# Patient Record
Sex: Male | Born: 1950
Health system: Southern US, Community
[De-identification: ages and names within clinical notes are randomized; demographics above are authoritative.]

## PROBLEM LIST (undated history)

## (undated) DIAGNOSIS — E119 Type 2 diabetes mellitus without complications: Secondary | ICD-10-CM

## (undated) DIAGNOSIS — K219 Gastro-esophageal reflux disease without esophagitis: Secondary | ICD-10-CM

## (undated) DIAGNOSIS — K279 Peptic ulcer, site unspecified, unspecified as acute or chronic, without hemorrhage or perforation: Secondary | ICD-10-CM

## (undated) DIAGNOSIS — I10 Essential (primary) hypertension: Secondary | ICD-10-CM

## (undated) HISTORY — PX: PROSTATE SURGERY: SHX751

## (undated) HISTORY — PX: COLONOSCOPY: SHX174

## (undated) HISTORY — PX: ESOPHAGOGASTRODUODENOSCOPY: SHX1529

---

## 2005-10-07 ENCOUNTER — Ambulatory Visit: Payer: Self-pay | Admitting: Gastroenterology

## 2005-10-28 ENCOUNTER — Ambulatory Visit: Payer: Self-pay | Admitting: Gastroenterology

## 2007-02-15 ENCOUNTER — Ambulatory Visit: Payer: Self-pay | Admitting: Internal Medicine

## 2007-03-19 ENCOUNTER — Ambulatory Visit: Payer: Self-pay | Admitting: Gastroenterology

## 2008-01-09 ENCOUNTER — Ambulatory Visit: Payer: Self-pay | Admitting: Family Medicine

## 2009-07-16 ENCOUNTER — Emergency Department: Payer: Self-pay | Admitting: Unknown Physician Specialty

## 2010-10-03 DIAGNOSIS — R39198 Other difficulties with micturition: Secondary | ICD-10-CM | POA: Insufficient documentation

## 2010-10-03 DIAGNOSIS — R972 Elevated prostate specific antigen [PSA]: Secondary | ICD-10-CM | POA: Insufficient documentation

## 2010-10-03 DIAGNOSIS — N401 Enlarged prostate with lower urinary tract symptoms: Secondary | ICD-10-CM | POA: Insufficient documentation

## 2010-10-03 DIAGNOSIS — R350 Frequency of micturition: Secondary | ICD-10-CM | POA: Insufficient documentation

## 2010-10-03 DIAGNOSIS — R351 Nocturia: Secondary | ICD-10-CM | POA: Insufficient documentation

## 2010-10-03 DIAGNOSIS — Z8042 Family history of malignant neoplasm of prostate: Secondary | ICD-10-CM | POA: Insufficient documentation

## 2010-10-14 DIAGNOSIS — R319 Hematuria, unspecified: Secondary | ICD-10-CM | POA: Insufficient documentation

## 2012-01-14 DIAGNOSIS — N99110 Postprocedural urethral stricture, male, meatal: Secondary | ICD-10-CM | POA: Insufficient documentation

## 2012-01-14 DIAGNOSIS — R399 Unspecified symptoms and signs involving the genitourinary system: Secondary | ICD-10-CM | POA: Insufficient documentation

## 2012-01-14 DIAGNOSIS — R339 Retention of urine, unspecified: Secondary | ICD-10-CM | POA: Insufficient documentation

## 2012-07-13 DIAGNOSIS — R310 Gross hematuria: Secondary | ICD-10-CM | POA: Insufficient documentation

## 2012-07-20 ENCOUNTER — Ambulatory Visit: Payer: Self-pay | Admitting: Urology

## 2012-07-27 ENCOUNTER — Ambulatory Visit: Payer: Self-pay | Admitting: Urology

## 2012-07-28 LAB — BASIC METABOLIC PANEL
Anion Gap: 4 — ABNORMAL LOW (ref 7–16)
BUN: 14 mg/dL (ref 7–18)
Chloride: 109 mmol/L — ABNORMAL HIGH (ref 98–107)
Co2: 26 mmol/L (ref 21–32)
Creatinine: 0.98 mg/dL (ref 0.60–1.30)
EGFR (African American): >60
Potassium: 4 mmol/L (ref 3.5–5.1)
Sodium: 139 mmol/L (ref 136–145)

## 2012-07-28 LAB — CBC WITH DIFFERENTIAL/PLATELET
Basophil #: 0.1 10*3/uL (ref 0.0–0.1)
Basophil %: 0.8 %
HCT: 42.6 % (ref 40.0–52.0)
Lymphocyte %: 17.5 %
MCH: 32 pg (ref 26.0–34.0)
MCV: 90 fL (ref 80–100)
Monocyte #: 0.9 x10 3/mm (ref 0.2–1.0)
Monocyte %: 10 %
Platelet: 119 10*3/uL — ABNORMAL LOW (ref 150–440)
RBC: 4.72 10*6/uL (ref 4.40–5.90)
WBC: 8.7 10*3/uL (ref 3.8–10.6)

## 2012-07-28 LAB — PATHOLOGY REPORT

## 2012-08-05 DIAGNOSIS — R338 Other retention of urine: Secondary | ICD-10-CM | POA: Insufficient documentation

## 2012-08-12 DIAGNOSIS — R3989 Other symptoms and signs involving the genitourinary system: Secondary | ICD-10-CM | POA: Insufficient documentation

## 2013-03-23 DIAGNOSIS — N390 Urinary tract infection, site not specified: Secondary | ICD-10-CM | POA: Insufficient documentation

## 2013-03-28 ENCOUNTER — Ambulatory Visit: Payer: Self-pay | Admitting: Urology

## 2013-03-28 LAB — PLATELET COUNT: PLATELETS: 160 10*3/uL (ref 150–440)

## 2013-03-28 LAB — BASIC METABOLIC PANEL
Anion Gap: 4 — ABNORMAL LOW (ref 7–16)
BUN: 14 mg/dL (ref 7–18)
CALCIUM: 8.6 mg/dL (ref 8.5–10.1)
CREATININE: 1.01 mg/dL (ref 0.60–1.30)
Chloride: 107 mmol/L (ref 98–107)
Co2: 28 mmol/L (ref 21–32)
EGFR (Non-African Amer.): >60
Glucose: 93 mg/dL (ref 65–99)
OSMOLALITY: 278 (ref 275–301)
Potassium: 3.7 mmol/L (ref 3.5–5.1)
SODIUM: 139 mmol/L (ref 136–145)

## 2013-04-05 ENCOUNTER — Ambulatory Visit: Payer: Self-pay | Admitting: Urology

## 2013-04-09 LAB — PATHOLOGY REPORT

## 2014-04-28 NOTE — Op Note (Signed)
PATIENT NAME:  Terry Long, Terry Long MR#:  [id number] DATE OF BIRTH:  1950/09/24  DATE OF PROCEDURE:  07/27/2012  PRINCIPAL DIAGNOSES: Benign prostatic hypertrophy with extensive intravesical protrusion and hematuria.   POSTOPERATIVE DIAGNOSES: Benign prostatic hypertrophy with extensive intravesical protrusion, hematuria, urethral polyp and urethral stricture.   PROCEDURE: Cystoscopy, urethral biopsy, urethral dilation, transurethral resection of the prostate.   SURGEON: Denice Bors. Jacqlyn Larsen, MD  ANESTHESIA: Laryngeal mask airway anesthesia.   INDICATIONS: The patient is a 64 year old Hispanic gentleman with a long history of benign prostatic hypertrophy. He has undergone a previous TURP. He has had significant burning and intermittent hematuria. On CT scan, he has extensive intravesical growth of prostate tissue filling approximately half to three-quarters of the bladder. There is extensive inflammatory change noted throughout with friability, resulting in hematuria. He presents for transurethral resection of as much of the intravesical tissue as possible.   DESCRIPTION OF PROCEDURE: After informed consent was obtained, the patient was taken to the operating room and placed in the dorsal lithotomy position under laryngeal mask airway anesthesia. The patient was then prepped and draped in the usual standard fashion. The saline resectoscope sheath was placed utilizing the visual obturator. In the mid bulbar urethra, narrowing was encountered which required dilation utilizing male sounds for advancement. The segment was approximately 1.5 cm in length. On the more proximal end of the stricture, a 5 to 6 mm polyp of tissue was encountered. This was removed utilizing biopsy forceps. The scope was advanced into the prostatic urethra. The verumontanum could easily be identified. Bilobar hypertrophy was noted with extensive polypoid growth formation and friability. Some adhesion of the anterior aspect of the  prostate tissue was identified. A central TUR defect was noted. Bilateral ureteral orifices were well visualized. They were very close to the site of previous resection. A large amount of intravesical tissue was noted laterally and anterior, with the bulk of the tissue being anterior. Extensive polypoid lesions with friability were noted on the extensive portion of intravesical tissue. The cystoscope was removed. The resectoscope sheath was placed once again utilizing the visual obturator. It was advanced with some resistance through the mid urethral stricture. It was advanced into the urinary bladder. The anterior adhesion of the prostate was separated as the scope was advanced, with slight bleeding encountered. The resectoscope loop was passed through the sheath. The areas of bleeding from the separated prostate tissue were cauterized for hemostasis. Resection was then begun at the level of the lateral lobe tissue just proximal to the verumontanum. This was utilized to help identify the ultimate site of resection to the level of the verumontanum. A large amount of anterior and lateral lobe tissue within the bladder was encountered. The right ureteral orifice was identified. A large amount of tissue was noted just medial. The resection was then begun at this level, with care taken to avoid injury to the ureteral orifice. The intravesical tissue was then taken down in a lateral to anterior fashion. Most of the tissue could be resected on the 9 to 6 o'clock sections of the intravesical tissue. The more anterior tissue, however, was more difficult to completely resect. The bulk of the tissue was resected from within the adenomatous growth. The scope was advanced as far as possible with the loop engaging the edges of the intravesical tissue and resecting as best possible. Concern, however, was over bladder perforation anteriorly, so this was minimized when visualization was limited. The left-sided tissue was taken down  similarly, with the bulk  of the tissue being identified on the left side. The more anterior tissue was essentially shelled from within the portion of adenoma. An approximate 1 to 2 cm rim of intravesical tissue with some polypoid features was left intact due to angle and inability to engage with the resectoscope even with pressure on the bladder. Any areas of bleeding were cauterized as the procedure progressed. Overall, minimal bleeding was encountered after the bulk of the resection was undertaken. The button electrode was then utilized to remove some additional lateral lobe tissue. The prostate was extensively cauterized throughout with the large amount of intravesical tissue. Some areas were more difficult to reach. The chips were irrigated free from the bladder. These were collected and will be sent for pathology analysis. Additional cauterization was then undertaken of any remaining bleeding areas. Total estimated blood loss was approximately 100 mL. The resectoscope was then removed. A 22 French 3-way Foley catheter was placed to gravity drainage, and 90 mL was placed into the balloon to help with tamponade. The catheter was placed to continuous bladder irrigation. The patient tolerated the procedure well. There were no problems or complications.  ____________________________ Denice Bors Jacqlyn Larsen, MD bsc:OSi D: 07/27/2012 09:29:29 ET T: 07/27/2012 09:38:02 ET JOB#: [id number]  cc: Denice Bors. Jacqlyn Larsen, MD, <Dictator> Denice Bors Lavine Hargrove MD ELECTRONICALLY SIGNED 07/29/2012 8:19

## 2014-04-29 NOTE — Op Note (Signed)
PATIENT NAME:  Terry Long, Terry Long MR#:  [id number] DATE OF BIRTH:  1950-08-22  DATE OF PROCEDURE:  04/05/2013  PRINCIPAL DIAGNOSES: Prostatic urethral calculus, regrowth prostate tissue, urethral stricture.   POSTOPERATIVE DIAGNOSES: Prostatic urethral calculus, regrowth prostate tissue, urethral stricture.  PROCEDURE: Cystoscopy, laser litholapaxy, transurethral resection of prostate, visual internal urethrotomy of urethral stricture with steroid injection.   SURGEON: Edrick Oh, MD   ANESTHESIA: Laryngeal mask airway anesthesia.   INDICATIONS: The patient is a 64 year old Hispanic gentleman with a history of significant BPH. He has undergone previous transurethral resection of the prostate. He has developed urethral stricture disease, which has required additional intervention. He also has significant persistent and regrowth prostate tissue affecting voiding. On recent cystoscopy was found to have a 1-cm stone lodged within the prostatic urethra near the verumontanum. The stone was unable to be pushed back into the urinary bladder. He presents for stone removal, transurethral resection of additional tissue, and treatment for the urethral stricture.   PROCEDURE: After informed consent was obtained, the patient was taken to the operating room and placed in the dorsal lithotomy position under laryngeal mask airway anesthesia. The patient was then prepped and draped in the usual standard fashion. An initial 71 French rigid cystoscope was introduced into the urethra. Slight resistance was met at the urethral meatus; however, the scope was able to be passed. The scope was then advanced to the level of the mid urethra where an approximate 16 French stricture was encountered. This was approximately 2.5 cm in length. An attempt was made at dilating the stricture utilizing the cystoscope. This was unsuccessful. A visual urethrotome was utilized to perform an incision at the 12 o'clock position through  the stricture. This allowed passage of the scope into the urinary bladder. The 22 French rigid scope was then inserted. Slight resistance was met at the site of the incision; however, the scope was able to be advanced through this region. Upon entering the prostatic fossa, the verumontanum was identified. Significant inflammatory tissue was encountered that was very friable. As the apical lobes of the prostate were separated moderate bleeding was encountered from friable tissue inside of the prostatic urethra. The stone was not initially identified. Visualization was limited to some degree due to bleeding. An open TUR defect could be identified. The stone was not initially visualized either in the bladder. Revisualization did find the stone within the prostatic urethra. It was able to be pushed back into the urinary bladder. The holmium laser fiber was then utilized to fragment the stone into multiple smaller pieces. The pieces were irrigated free from the bladder. The cystoscope was removed after collecting the stone fragments. These will be sent for stone analysis. The resectoscope sheath was then advanced at the level of the stricture and incision. The scope was unable to be advanced any further. A 8 [place] male sound was utilized to further dilate the urethra to allow passage. The resectoscope sheath was then able to be passed through this area into the urinary bladder. Once again visualization was limited due to bleeding from the friable apical tissue. The button electrode was utilized through the resectoscope to fulgurate the edges of the apical prostate tissue. This allowed better visualization and control of the venous ooze. Once this was accomplished, a moderate amount of apical tissue was noted to remain with significant inflammation. This was the site of the initial stone impaction. The remainder of the prostatic urethra was more than adequate open. The bladder neck was also more than  adequately open. A  moderate amount of regrowth tissue was noted on the left lateral prostate extending into the bladder, some of this tissue also extended anterior. The resectoscope loop was then utilized to resect the tissue between the 12 and 5 o'clock position along the bladder neck. This included the intravesical tissue as best possible. Significant polypoid inflammatory changes were noted over this tissue. Additional apical tissue was then resected to the level of the verumontanum. Some apical tissue was left remaining for concerns over continence. The areas were then extensively cauterized utilizing the button electrode. The TUR chips were irrigated free from the bladder. They were collected and will be sent for pathology analysis. Some additional fulguration was needed at the apical tissue for good hemostasis. The resectoscope was then removed. The 24 French rigid cystoscope was reintroduced into the urethra, 80 mg of Kenalog was diluted to 5 mm with sterile saline. It was injected at the site of the visual internal urethrotomy and at the meatus to minimize potential recurrent scar. The scope was then removed. A 22 French Foley catheter was placed to gravity drainage. Approximately 90 mL was placed into the catheter balloon to aid with hemostasis. The patient was returned to the supine position and was taken to the recovery room in stable condition. There were no problems or complications. The patient tolerated the procedure well.     ____________________________ Denice Bors. Jacqlyn Larsen, MD bsc:lt D: 04/05/2013 21:12:44 ET T: 04/05/2013 21:43:27 ET JOB#: [id number]  cc: Denice Bors. Jacqlyn Larsen, MD, <Dictator> Denice Bors Inez Rosato MD ELECTRONICALLY SIGNED 04/08/2013 8:14

## 2014-12-13 DIAGNOSIS — N486 Induration penis plastica: Secondary | ICD-10-CM | POA: Insufficient documentation

## 2015-05-25 DIAGNOSIS — J309 Allergic rhinitis, unspecified: Secondary | ICD-10-CM | POA: Insufficient documentation

## 2015-12-10 ENCOUNTER — Encounter: Payer: Self-pay | Admitting: *Deleted

## 2015-12-11 ENCOUNTER — Ambulatory Visit
Admission: RE | Admit: 2015-12-11 | Payer: BLUE CROSS/BLUE SHIELD | Source: Ambulatory Visit | Admitting: Gastroenterology

## 2015-12-11 ENCOUNTER — Encounter: Admission: RE | Payer: Self-pay | Source: Ambulatory Visit

## 2015-12-11 HISTORY — DX: Essential (primary) hypertension: I10

## 2015-12-11 HISTORY — DX: Peptic ulcer, site unspecified, unspecified as acute or chronic, without hemorrhage or perforation: K27.9

## 2015-12-11 SURGERY — COLONOSCOPY WITH PROPOFOL
Anesthesia: General

## 2016-04-03 ENCOUNTER — Other Ambulatory Visit: Payer: Self-pay | Admitting: Gastroenterology

## 2016-04-03 DIAGNOSIS — K219 Gastro-esophageal reflux disease without esophagitis: Secondary | ICD-10-CM

## 2016-04-03 DIAGNOSIS — R1310 Dysphagia, unspecified: Secondary | ICD-10-CM

## 2016-04-03 DIAGNOSIS — R1013 Epigastric pain: Secondary | ICD-10-CM

## 2016-08-04 ENCOUNTER — Ambulatory Visit: Admit: 2016-08-04 | Payer: BLUE CROSS/BLUE SHIELD | Admitting: Gastroenterology

## 2016-10-23 ENCOUNTER — Encounter: Payer: Self-pay | Admitting: *Deleted

## 2016-10-24 ENCOUNTER — Encounter: Payer: Self-pay | Admitting: *Deleted

## 2016-10-24 ENCOUNTER — Ambulatory Visit: Payer: Medicare Other | Admitting: Registered Nurse

## 2016-10-24 ENCOUNTER — Encounter
Admission: RE | Disposition: A | Discharge status: Home / Self Care | Payer: Self-pay | Source: Ambulatory Visit | Attending: Unknown Physician Specialty

## 2016-10-24 ENCOUNTER — Ambulatory Visit
Admission: RE | Admit: 2016-10-24 | Discharge: 2016-10-24 | Disposition: A | Discharge status: Home / Self Care | Payer: Medicare Other | Source: Ambulatory Visit | Attending: Unknown Physician Specialty | Admitting: Unknown Physician Specialty

## 2016-10-24 DIAGNOSIS — Z8711 Personal history of peptic ulcer disease: Secondary | ICD-10-CM | POA: Insufficient documentation

## 2016-10-24 DIAGNOSIS — K648 Other hemorrhoids: Secondary | ICD-10-CM | POA: Diagnosis not present

## 2016-10-24 DIAGNOSIS — I10 Essential (primary) hypertension: Secondary | ICD-10-CM | POA: Diagnosis not present

## 2016-10-24 DIAGNOSIS — K5730 Diverticulosis of large intestine without perforation or abscess without bleeding: Secondary | ICD-10-CM | POA: Diagnosis not present

## 2016-10-24 DIAGNOSIS — R1310 Dysphagia, unspecified: Secondary | ICD-10-CM | POA: Diagnosis not present

## 2016-10-24 DIAGNOSIS — Z79899 Other long term (current) drug therapy: Secondary | ICD-10-CM | POA: Insufficient documentation

## 2016-10-24 DIAGNOSIS — Z1211 Encounter for screening for malignant neoplasm of colon: Secondary | ICD-10-CM | POA: Insufficient documentation

## 2016-10-24 DIAGNOSIS — K449 Diaphragmatic hernia without obstruction or gangrene: Secondary | ICD-10-CM | POA: Insufficient documentation

## 2016-10-24 DIAGNOSIS — K219 Gastro-esophageal reflux disease without esophagitis: Secondary | ICD-10-CM | POA: Diagnosis not present

## 2016-10-24 HISTORY — DX: Gastro-esophageal reflux disease without esophagitis: K21.9

## 2016-10-24 HISTORY — PX: ESOPHAGOGASTRODUODENOSCOPY (EGD) WITH PROPOFOL: SHX5813

## 2016-10-24 HISTORY — PX: COLONOSCOPY: SHX5424

## 2016-10-24 SURGERY — COLONOSCOPY WITH PROPOFOL
Anesthesia: General

## 2016-10-24 SURGERY — COLONOSCOPY
Anesthesia: General

## 2016-10-24 MED ORDER — PROPOFOL 500 MG/50ML IV EMUL
INTRAVENOUS | Status: AC
  Filled 2016-10-24: qty 50

## 2016-10-24 MED ORDER — MIDAZOLAM HCL 2 MG/2ML IJ SOLN
INTRAMUSCULAR | Status: AC
  Filled 2016-10-24: qty 2

## 2016-10-24 MED ORDER — GLYCOPYRROLATE 0.2 MG/ML IJ SOLN
INTRAMUSCULAR | Status: DC | PRN
  Administered 2016-10-24: 0.2 mg via INTRAVENOUS

## 2016-10-24 MED ORDER — GLYCOPYRROLATE 0.2 MG/ML IJ SOLN
INTRAMUSCULAR | Status: AC
  Filled 2016-10-24: qty 1

## 2016-10-24 MED ORDER — PROPOFOL 10 MG/ML IV BOLUS
INTRAVENOUS | Status: DC | PRN
  Administered 2016-10-24: 70 mg via INTRAVENOUS
  Administered 2016-10-24: 20 mg via INTRAVENOUS

## 2016-10-24 MED ORDER — FENTANYL CITRATE (PF) 100 MCG/2ML IJ SOLN
INTRAMUSCULAR | Status: AC
  Filled 2016-10-24: qty 2

## 2016-10-24 MED ORDER — PROPOFOL 500 MG/50ML IV EMUL
INTRAVENOUS | Status: DC | PRN
  Administered 2016-10-24: 140 ug/kg/min via INTRAVENOUS

## 2016-10-24 MED ORDER — SODIUM CHLORIDE 0.9 % IV SOLN
INTRAVENOUS | Status: DC
  Administered 2016-10-24: 09:00:00 via INTRAVENOUS

## 2016-10-24 MED ORDER — MIDAZOLAM HCL 2 MG/2ML IJ SOLN
INTRAMUSCULAR | Status: DC | PRN
  Administered 2016-10-24: 1 mg via INTRAVENOUS

## 2016-10-24 MED ORDER — LIDOCAINE HCL (CARDIAC) 20 MG/ML IV SOLN
INTRAVENOUS | Status: DC | PRN
  Administered 2016-10-24: 40 mg via INTRAVENOUS

## 2016-10-24 MED ORDER — SODIUM CHLORIDE 0.9 % IV SOLN: INTRAVENOUS | Status: DC

## 2016-10-24 MED ORDER — FENTANYL CITRATE (PF) 100 MCG/2ML IJ SOLN
INTRAMUSCULAR | Status: DC | PRN
  Administered 2016-10-24: 25 ug via INTRAVENOUS

## 2016-10-24 MED ORDER — LIDOCAINE HCL (PF) 2 % IJ SOLN
INTRAMUSCULAR | Status: AC
  Filled 2016-10-24: qty 10

## 2016-10-24 NOTE — Op Note (Signed)
[facility] Gastroenterology Patient Name: Terry Long [facility] Procedure Date: 10/24/2016 9:08 AM MRN: [id number] Account #: [id number] Date of Birth: 08-Jun-1950 Admit Type: Outpatient Age: 66 Room: [facility] [address] ENDO ROOM 1 Gender: Male Note Status: Finalized Procedure:            Upper GI endoscopy Indications:          Dysphagia, Heartburn Providers:            Manya Silvas, MD Referring MD:         Bethena Roys. Sowles, MD (Referring MD) Medicines:            Propofol per Anesthesia Complications:        No immediate complications. Procedure:            Pre-Anesthesia Assessment:                       - After reviewing the risks and benefits, the patient                        was deemed in satisfactory condition to undergo the                        procedure.                       After obtaining informed consent, the endoscope was                        passed under direct vision. Throughout the procedure,                        the patient's blood pressure, pulse, and oxygen                        saturations were monitored continuously. The Endoscope                        was introduced through the mouth, and advanced to the                        second part of duodenum. The upper GI endoscopy was                        accomplished without difficulty. The patient tolerated                        the procedure well. Findings:      The Z-line was irregular and was found 40 cm from the incisors.      A small hiatal hernia was present.      Diffuse very mildly erythematous mucosa without bleeding was found in       the gastric body and in the gastric antrum. Biopsies were taken with a       cold forceps for histology. Biopsies were taken with a cold forceps for       Helicobacter pylori testing.      The examined duodenum was normal. Impression:           - Z-line irregular, 40 cm from the incisors.                       -  Small hiatal hernia.               - Erythematous mucosa in the gastric body and antrum.                        Biopsied.                       - Normal examined duodenum. Recommendation:       - Await pathology results. Manya Silvas, MD 10/24/2016 9:22:29 AM This report has been signed electronically. Number of Addenda: 0 Note Initiated On: 10/24/2016 9:08 AM      [place]

## 2016-10-24 NOTE — H&P (Signed)
   Primary Care Physician:  Patient, No Pcp Per Primary Gastroenterologist:  Dr. Vira Agar  Pre-Procedure History & Physical: HPI:  Terry Long is a 66 y.o. male is here for a colonoscopy and EGD.   Past Medical History:  Diagnosis Date  . GERD (gastroesophageal reflux disease)   . Hypertension   . Peptic ulcer     Past Surgical History:  Procedure Laterality Date  . COLONOSCOPY    . ESOPHAGOGASTRODUODENOSCOPY    . PROSTATE SURGERY      Prior to Admission medications   Medication Sig Start Date End Date Taking? Authorizing Provider  amLODipine (NORVASC) 5 MG tablet Take 5 mg by mouth daily.   Yes [provider]  fluticasone (FLONASE) 50 MCG/ACT nasal spray Place 1 spray into both nostrils 2 (two) times daily.   Yes [provider]  omeprazole (PRILOSEC) 40 MG capsule Take 40 mg by mouth 2 (two) times daily.   Yes [provider]  polyethylene glycol powder (GLYCOLAX/MIRALAX) powder Take 1 Container by mouth once.   Yes [provider]  sucralfate (CARAFATE) 1 g tablet Take 1 g by mouth 4 (four) times daily -  with meals and at bedtime.    [provider]    Allergies as of 07/23/2016  . (No Known Allergies)    History reviewed. No pertinent family history.  Social History   Social History  . Marital status: Married    Spouse name: N/A  . Number of children: N/A  . Years of education: N/A   Occupational History  . Not on file.   Social History Main Topics  . Smoking status: Never Smoker  . Smokeless tobacco: Never Used  . Alcohol use No  . Drug use: No  . Sexual activity: Not on file   Other Topics Concern  . Not on file   Social History Narrative  . No narrative on file    Review of Systems: See HPI, otherwise negative ROS  Physical Exam: BP (!) 142/57   Pulse 73   Temp (!) 97.2 F (36.2 C) (Tympanic)   Resp 18   Ht 5\' 6"  (1.676 m)   Wt 77.6 kg (171 lb)   SpO2 100%   BMI 27.60 kg/m   General:   Alert,  pleasant and cooperative in NAD Head:  Normocephalic and atraumatic. Neck:  Supple; no masses or thyromegaly. Lungs:  Clear throughout to auscultation.    Heart:  Regular rate and rhythm. Abdomen:  Soft, nontender and nondistended. Normal bowel sounds, without guarding, and without rebound.   Neurologic:  Alert and  oriented x4;  grossly normal neurologically.  Impression/Plan: Terry Long is here for an colonoscopy and an EGD  to be performed for colon screening, epigastric pain, GERD, Dysphagia.  Risks, benefits, limitations, and alternatives regarding  endoscopy and colonoscopy have been reviewed with the patient.  Questions have been answered.  All parties agreeable.   Gaylyn Cheers, MD  10/24/2016, 9:06 AM

## 2016-10-24 NOTE — Transfer of Care (Signed)
Immediate Anesthesia Transfer of Care Note  Patient: Environmental education officer  Procedure(s) Performed: COLONOSCOPY (N/A ) ESOPHAGOGASTRODUODENOSCOPY (EGD) WITH PROPOFOL (N/A )  Patient Location: PACU  Anesthesia Type:General  Level of Consciousness: sedated  Airway & Oxygen Therapy: Patient Spontanous Breathing and Patient connected to nasal cannula oxygen  Post-op Assessment: Report given to RN and Post -op Vital signs reviewed and unstable, Anesthesiologist notified  Post vital signs: Reviewed and stable  Last Vitals:  Vitals:   10/24/16 0941 10/24/16 0942  BP: 96/65 96/65  Pulse: 61 61  Resp: 16 16  Temp: (!) 36.1 C (!) 36.1 C  SpO2: 95% 96%    Last Pain:  Vitals:   10/24/16 0942  TempSrc: Temporal         Complications: No apparent anesthesia complications

## 2016-10-24 NOTE — Op Note (Signed)
[facility] Gastroenterology Patient Name: Terry Long [facility] Procedure Date: 10/24/2016 9:07 AM MRN: [id number] Account #: [id number] Date of Birth: [birth year] Admit Type: Outpatient Age: 66 Room: [facility] ENDO ROOM 1 Gender: Male Note Status: Finalized Procedure:            Colonoscopy Indications:          Screening for colorectal malignant neoplasm Providers:            Manya Silvas, MD Referring MD:         Bethena Roys. Sowles, MD (Referring MD) Medicines:            Propofol per Anesthesia Complications:        No immediate complications. Procedure:            Pre-Anesthesia Assessment:                       - After reviewing the risks and benefits, the patient                        was deemed in satisfactory condition to undergo the                        procedure.                       After obtaining informed consent, the colonoscope was                        passed under direct vision. Throughout the procedure,                        the patient's blood pressure, pulse, and oxygen                        saturations were monitored continuously. The                        Colonoscope was introduced through the anus and                        advanced to the the cecum, identified by appendiceal                        orifice and ileocecal valve. The colonoscopy was                        performed without difficulty. The patient tolerated the                        procedure well. The quality of the bowel preparation                        was excellent. Findings:      A few small-mouthed diverticula were found in the sigmoid colon.      Internal hemorrhoids were found during endoscopy. The hemorrhoids were       small and Grade I (internal hemorrhoids that do not prolapse).      The exam was otherwise without abnormality. Impression:           - Diverticulosis in the sigmoid colon.                       -  Internal hemorrhoids.       - The examination was otherwise normal.                       - No specimens collected. Recommendation:       - Repeat colonoscopy in 10 years for screening purposes. Manya Silvas, MD 10/24/2016 9:36:53 AM This report has been signed electronically. Number of Addenda: 0 Note Initiated On: 10/24/2016 9:07 AM Scope Withdrawal Time: 0 hours 5 minutes 45 seconds  Total Procedure Duration: 0 hours 11 minutes 4 seconds       [facility]

## 2016-10-24 NOTE — Anesthesia Post-op Follow-up Note (Signed)
Anesthesia QCDR form completed.        

## 2016-10-24 NOTE — Anesthesia Preprocedure Evaluation (Addendum)
Anesthesia Evaluation  Patient identified by MRN, date of birth, ID band Patient awake    Reviewed: Allergy & Precautions, H&P , NPO status , Patient's Chart, lab work & pertinent test results, reviewed documented beta blocker date and time   History of Anesthesia Complications Negative for: history of anesthetic complications  Airway Mallampati: II  TM Distance: >3 FB Neck ROM: full    Dental  (+) Partial Lower, Dental Advidsory Given, Missing   Pulmonary neg pulmonary ROS,           Cardiovascular Exercise Tolerance: Good hypertension, (-) angina(-) CAD, (-) Past MI, (-) Cardiac Stents and (-) CABG (-) dysrhythmias (-) Valvular Problems/Murmurs     Neuro/Psych negative neurological ROS  negative psych ROS   GI/Hepatic Neg liver ROS, PUD, GERD  ,  Endo/Other  negative endocrine ROS  Renal/GU negative Renal ROS  negative genitourinary   Musculoskeletal   Abdominal   Peds  Hematology negative hematology ROS (+)   Anesthesia Other Findings Past Medical History: No date: GERD (gastroesophageal reflux disease) No date: Hypertension No date: Peptic ulcer   Reproductive/Obstetrics negative OB ROS                             Anesthesia Physical Anesthesia Plan  ASA: II  Anesthesia Plan: General   Post-op Pain Management:    Induction: Intravenous  PONV Risk Score and Plan: 2 and Propofol infusion  Airway Management Planned: Nasal Cannula  Additional Equipment:   Intra-op Plan:   Post-operative Plan:   Informed Consent: I have reviewed the patients History and Physical, chart, labs and discussed the procedure including the risks, benefits and alternatives for the proposed anesthesia with the patient or authorized representative who has indicated his/her understanding and acceptance.   Dental Advisory Given  Plan Discussed with: Anesthesiologist, CRNA and Surgeon  Anesthesia  Plan Comments:         Anesthesia Quick Evaluation

## 2016-10-27 NOTE — Anesthesia Postprocedure Evaluation (Signed)
Anesthesia Post Note  Patient: Terry Long  Procedure(s) Performed: COLONOSCOPY (N/A ) ESOPHAGOGASTRODUODENOSCOPY (EGD) WITH PROPOFOL (N/A )  Patient location during evaluation: Endoscopy Anesthesia Type: General Level of consciousness: awake and alert Pain management: pain level controlled Vital Signs Assessment: post-procedure vital signs reviewed and stable Respiratory status: spontaneous breathing, nonlabored ventilation, respiratory function stable and patient connected to nasal cannula oxygen Cardiovascular status: blood pressure returned to baseline and stable Postop Assessment: no apparent nausea or vomiting Anesthetic complications: no     Last Vitals:  Vitals:   10/24/16 1001 10/24/16 1011  BP: 107/74 121/76  Pulse: (!) 52 (!) 49  Resp: 15 14  Temp:    SpO2: 96% 100%    Last Pain:  Vitals:   10/27/16 0736  TempSrc:   PainSc: 0-No pain                 Martha Clan

## 2016-10-28 ENCOUNTER — Encounter: Payer: Self-pay | Admitting: Unknown Physician Specialty

## 2016-10-28 LAB — SURGICAL PATHOLOGY

## 2017-06-05 ENCOUNTER — Emergency Department
Admission: EM | Admit: 2017-06-05 | Discharge: 2017-06-05 | Disposition: A | Discharge status: Home / Self Care | Payer: Medicare Other | Attending: Emergency Medicine | Admitting: Emergency Medicine

## 2017-06-05 ENCOUNTER — Encounter: Payer: Self-pay | Admitting: Emergency Medicine

## 2017-06-05 ENCOUNTER — Other Ambulatory Visit: Payer: Self-pay

## 2017-06-05 ENCOUNTER — Emergency Department: Payer: Medicare Other

## 2017-06-05 DIAGNOSIS — E119 Type 2 diabetes mellitus without complications: Secondary | ICD-10-CM | POA: Diagnosis not present

## 2017-06-05 DIAGNOSIS — Z79899 Other long term (current) drug therapy: Secondary | ICD-10-CM | POA: Diagnosis not present

## 2017-06-05 DIAGNOSIS — I10 Essential (primary) hypertension: Secondary | ICD-10-CM | POA: Insufficient documentation

## 2017-06-05 DIAGNOSIS — N452 Orchitis: Secondary | ICD-10-CM | POA: Diagnosis not present

## 2017-06-05 DIAGNOSIS — N50812 Left testicular pain: Secondary | ICD-10-CM

## 2017-06-05 DIAGNOSIS — N390 Urinary tract infection, site not specified: Secondary | ICD-10-CM | POA: Diagnosis not present

## 2017-06-05 LAB — URINALYSIS, COMPLETE (UACMP) WITH MICROSCOPIC
BILIRUBIN URINE: NEGATIVE
KETONES UR: NEGATIVE mg/dL
NITRITE: NEGATIVE
Protein, ur: 100 mg/dL — AB
RBC / HPF: >50 RBC/hpf — ABNORMAL HIGH (ref 0–5)
SPECIFIC GRAVITY, URINE: 1.017 (ref 1.005–1.030)
WBC, UA: >50 WBC/hpf — ABNORMAL HIGH (ref 0–5)
pH: 5 (ref 5.0–8.0)

## 2017-06-05 LAB — CBC WITH DIFFERENTIAL/PLATELET
BASOS ABS: 0.1 10*3/uL (ref 0–0.1)
BASOS PCT: 0 %
EOS PCT: 0 %
Eosinophils Absolute: 0 10*3/uL (ref 0–0.7)
HEMATOCRIT: 44.3 % (ref 40.0–52.0)
Hemoglobin: 15.6 g/dL (ref 13.0–18.0)
Lymphocytes Relative: 6 %
Lymphs Abs: 1 10*3/uL (ref 1.0–3.6)
MCH: 32.1 pg (ref 26.0–34.0)
MCHC: 35.2 g/dL (ref 32.0–36.0)
MCV: 91.2 fL (ref 80.0–100.0)
MONO ABS: 1 10*3/uL (ref 0.2–1.0)
MONOS PCT: 6 %
NEUTROS ABS: 14.2 10*3/uL — AB (ref 1.4–6.5)
Neutrophils Relative %: 88 %
PLATELETS: 147 10*3/uL — AB (ref 150–440)
RBC: 4.86 MIL/uL (ref 4.40–5.90)
RDW: 13.4 % (ref 11.5–14.5)
WBC: 16.3 10*3/uL — ABNORMAL HIGH (ref 3.8–10.6)

## 2017-06-05 LAB — CHLAMYDIA/NGC RT PCR (ARMC ONLY)
CHLAMYDIA TR: NOT DETECTED
N gonorrhoeae: NOT DETECTED

## 2017-06-05 LAB — BASIC METABOLIC PANEL
ANION GAP: 10 (ref 5–15)
BUN: 15 mg/dL (ref 6–20)
CALCIUM: 8.2 mg/dL — AB (ref 8.9–10.3)
CO2: 17 mmol/L — AB (ref 22–32)
CREATININE: 0.97 mg/dL (ref 0.61–1.24)
Chloride: 105 mmol/L (ref 101–111)
GFR calc non Af Amer: >60 mL/min (ref >60)
Glucose, Bld: 285 mg/dL — ABNORMAL HIGH (ref 65–99)
Potassium: 3.4 mmol/L — ABNORMAL LOW (ref 3.5–5.1)
SODIUM: 132 mmol/L — AB (ref 135–145)

## 2017-06-05 MED ORDER — SODIUM CHLORIDE 0.9 % IV SOLN
1.0000 g | Freq: Once | INTRAVENOUS | Status: AC
  Administered 2017-06-05: 1 g via INTRAVENOUS
  Filled 2017-06-05: qty 10

## 2017-06-05 MED ORDER — HYDROCODONE-ACETAMINOPHEN 5-325 MG PO TABS: 1.0000 | ORAL_TABLET | ORAL | 0 refills | Status: DC | PRN

## 2017-06-05 MED ORDER — LEVOFLOXACIN 500 MG PO TABS: 500.0000 mg | ORAL_TABLET | Freq: Every day | ORAL | 0 refills | Status: AC

## 2017-06-05 MED ORDER — SODIUM CHLORIDE 0.9 % IV BOLUS
1000.0000 mL | Freq: Once | INTRAVENOUS | Status: AC
  Administered 2017-06-05: 1000 mL via INTRAVENOUS

## 2017-06-05 MED ORDER — MORPHINE SULFATE (PF) 4 MG/ML IV SOLN
4.0000 mg | Freq: Once | INTRAVENOUS | Status: AC
  Administered 2017-06-05: 4 mg via INTRAVENOUS
  Filled 2017-06-05: qty 1

## 2017-06-05 MED ORDER — METFORMIN HCL 500 MG PO TABS: 500.0000 mg | ORAL_TABLET | Freq: Two times a day (BID) | ORAL | 0 refills | Status: DC

## 2017-06-05 MED ORDER — ONDANSETRON HCL 4 MG/2ML IJ SOLN
4.0000 mg | Freq: Once | INTRAMUSCULAR | Status: AC
  Administered 2017-06-05: 4 mg via INTRAVENOUS
  Filled 2017-06-05: qty 2

## 2017-06-05 NOTE — ED Notes (Signed)
Pt alert and oriented X4, active, cooperative, pt in NAD. RR even and unlabored, color WNL.  Pt informed to return if any life threatening symptoms occur.  Discharge and followup instructions reviewed.  

## 2017-06-05 NOTE — ED Provider Notes (Addendum)
[facility] Emergency Department Provider Note  Time seen: 10:50 AM  I have reviewed the triage vital signs and the nursing notes.   HISTORY  Chief Complaint Testicle Pain    HPI Terry Long is a 67 y.o. male with a past medical history of hypertension, gastric reflux, presents to the emergency department for left testicular pain.  According to the patient since last night he has been experiencing left testicular pain.  Denies dysuria but states he has had cloudy urine.  States he feels like the left testicle is swollen, very painful to walk.  Patient denies any known fever.  Denies abdominal pain nausea vomiting or diarrhea.  Largely negative review of systems otherwise.   Past Medical History:  Diagnosis Date  . GERD (gastroesophageal reflux disease)   . Hypertension   . Peptic ulcer     There are no active problems to display for this patient.   Past Surgical History:  Procedure Laterality Date  . COLONOSCOPY    . COLONOSCOPY N/A 10/24/2016   Procedure: COLONOSCOPY;  Surgeon: Manya Silvas, MD;  Location: [facility] ENDOSCOPY;  Service: Endoscopy;  Laterality: N/A;  . ESOPHAGOGASTRODUODENOSCOPY    . ESOPHAGOGASTRODUODENOSCOPY (EGD) WITH PROPOFOL N/A 10/24/2016   Procedure: ESOPHAGOGASTRODUODENOSCOPY (EGD) WITH PROPOFOL;  Surgeon: Manya Silvas, MD;  Location: [facility] ENDOSCOPY;  Service: Endoscopy;  Laterality: N/A;  . PROSTATE SURGERY      Prior to Admission medications   Medication Sig Start Date End Date Taking? Authorizing Provider  amLODipine (NORVASC) 5 MG tablet Take 5 mg by mouth daily.    [provider]  fluticasone (FLONASE) 50 MCG/ACT nasal spray Place 1 spray into both nostrils 2 (two) times daily.    [provider]  omeprazole (PRILOSEC) 40 MG capsule Take 40 mg by mouth 2 (two) times daily.    [provider]  polyethylene glycol powder (GLYCOLAX/MIRALAX) powder Take 1 Container by mouth once.     [provider]  sucralfate (CARAFATE) 1 g tablet Take 1 g by mouth 4 (four) times daily -  with meals and at bedtime.    [provider]    No Known Allergies  History reviewed. No pertinent family history.  Social History Social History   Tobacco Use  . Smoking status: Never Smoker  . Smokeless tobacco: Never Used  Substance Use Topics  . Alcohol use: No  . Drug use: No    Review of Systems Constitutional: Negative for fever. Eyes: Negative for visual complaints ENT: Negative for recent illness/congestion Cardiovascular: Negative for chest pain. Respiratory: Negative for shortness of breath. Gastrointestinal: Negative for abdominal pain, vomiting and diarrhea. Genitourinary: Left testicular pain.  No dysuria but cloudy urine.  Patient is sexually active. Musculoskeletal: Negative for musculoskeletal complaints Skin: Negative for skin complaints  Neurological: Negative for headache All other ROS negative  ____________________________________________   PHYSICAL EXAM:  VITAL SIGNS: ED Triage Vitals  Enc Vitals Group     BP 06/05/17 0811 (!) 128/59     Pulse Rate 06/05/17 0811 84     Resp 06/05/17 0811 16     Temp 06/05/17 0811 99.4 F (37.4 C)     Temp Source 06/05/17 0811 Oral     SpO2 06/05/17 0811 96 %     Weight 06/05/17 0810 169 lb (76.7 kg)     Height 06/05/17 0810 5\' 6"  (1.676 m)     Head Circumference --      Peak Flow --  Pain Score 06/05/17 0810 10     Pain Loc --      Pain Edu? --      Excl. in [place]? --     Constitutional: Alert and oriented.  Overall well-appearing but appears to be in pain  Eyes: Normal exam ENT   Head: Normocephalic and atraumatic.   Mouth/Throat: Mucous membranes are moist. Cardiovascular: Normal rate, regular rhythm. No murmur Respiratory: Normal respiratory effort without tachypnea nor retractions. Breath sounds are clear  Gastrointestinal: Soft and nontender. No distention.  Genitourinary:  Mild erythema of the left scrotum with mild swelling of the left testicle significant tenderness to palpation of the left testicle. Musculoskeletal: Nontender with normal range of motion in all extremities.  Neurologic:  Normal speech and language. No gross focal neurologic deficits Skin:  Skin is warm, dry and intact.  Psychiatric: Mood and affect are normal.   ____________________________________________     RADIOLOGY  Ultrasound shows possible orchitis  ____________________________________________   INITIAL IMPRESSION / ASSESSMENT AND PLAN / ED COURSE  Pertinent labs & imaging results that were available during my care of the patient were reviewed by me and considered in my medical decision making (see chart for details).  Patient presents to the emergency department for left testicular pain since last night.  Differential would include testicular torsion, epididymitis, orchitis.  We will check labs, ultrasound and continue to closely monitor.  Patient's blood work is resulted showing elevated blood glucose of 285 with greater than 500 glucose in the urine.  No history of diabetes per patient.  Appears to be new onset diabetes.  Anion gap is normal.  Patient does have a moderate leukocytosis of 16,000.  Urinalysis consistent with urinary tract infection.  I have added on a gonorrhea/chlamydia test to the urine as well.  We will treat with IV Rocephin.  Will obtain an ultrasound to further evaluate.  Patient is white count 16,000, urine is consistent with a urinary tract infection.  Ultrasound is consistent with possible orchitis.  Patient has received IV Rocephin in the emergency department.  We will discharge with 10 days of Levaquin, pain medication.  I discussed with the patient follow-up with urology, return to the emergency department for any significant increase in pain or for any fever.  Patient agreeable to plan of care.  The patient's elevated blood glucose we will start the  patient on metformin 500 mg twice daily and have the patient follow-up with his primary care doctor.  ____________________________________________   FINAL CLINICAL IMPRESSION(S) / ED DIAGNOSES  Urinary tract infection New onset diabetes orchitis   Harvest Dark, MD 06/05/17 1300    Harvest Dark, MD 06/05/17 [phone]

## 2017-06-05 NOTE — Discharge Instructions (Addendum)
Please return to the emergency department for any significant increase in pain or if you develop a fever 101 or higher.  Otherwise please follow-up with urology by calling the number provided.  Please begin taking your metformin 500 mg twice daily.  Please follow-up with your primary care doctor within the next 1 week for recheck/reevaluation.  Your blood glucose today was elevated consistent with possible new onset diabetes.

## 2017-06-05 NOTE — ED Notes (Signed)
Patient transported to Ultrasound 

## 2017-06-05 NOTE — ED Triage Notes (Signed)
Here for left testicular pain and pt thinks it is swollen. Some pain in left hip radiating to testicle but denies pain in flank area. Denies urinary sx.  Low grade temp.   Discussed with dr Corky Downs will wait till MD eval for any possible imaging.

## 2017-06-07 LAB — URINE CULTURE: Culture: >=100000 — AB

## 2017-06-08 NOTE — Progress Notes (Signed)
ED Antimicrobial Stewardship Positive Culture Follow Up   [facility] Terry Long is an 67 y.o. male who presented to [facility] on 06/05/2017 with a chief complaint of testicle pain. UA consistent with UTI and BMP showed elevated BG (285 mg/dL). Diagnosed with UTI, orchitis and new onset DM. Was sent home with pain medication and levofloxacin--urine culture sensitivities show resistance to levofloxacin.   [x]  Being treated with levofloxacin 500mg  daily x 10 days ([place] 5/31) and Bactrim DS BID x14 days ([facility] 5/30), but organism resistant to prescribed antimicrobials  New antibiotic prescription: Keflex 500mg  BID x 10 days   Discussed with patient and patient's wife today via phone to stop the levofloxacin and start a new antibiotic, Keflex, which will be called in to [facility] on [address]. Discussed importance of taking antibiotic as prescribed and to finish entire course. Patients wife expressed concern regarding patient's newly prescribed metformin and advised patient to follow up with PCP regarding high blood sugars.   ED Provider: Dr. Cinda Quest    Chief Complaint  Patient presents with  . Testicle Pain    Recent Results (from the past 720 hour(s))  Chlamydia/NGC rt PCR ([facility] only)     Status: None   Collection Time: 06/05/17  8:17 AM  Result Value Ref Range Status   Specimen source GC/Chlam URINE, RANDOM  Final   Chlamydia Tr NOT DETECTED NOT DETECTED Final   N gonorrhoeae NOT DETECTED NOT DETECTED Final    Comment: (NOTE) 100  This methodology has not been evaluated in pregnant women or in 200  patients with a history of hysterectomy. 300 400  This methodology will not be performed on patients less than 3  years of age. Performed at [facility], [address] [place], [place] [address]   Urine Culture     Status: Abnormal   Collection Time: 06/05/17  8:17 AM  Result Value Ref Range Status   Specimen Description   Final    URINE,  RANDOM Performed at [facility], [address] [place], [place] [address]    Special Requests   Final    NONE Performed at [facility], [address] [place], [place] [address]    Culture >=100,000 COLONIES/mL ESCHERICHIA COLI (A)  Final   Report Status 06/07/2017 FINAL  Final   Organism ID, Bacteria ESCHERICHIA COLI (A)  Final      Susceptibility   Escherichia coli - MIC*    AMPICILLIN 16 INTERMEDIATE Intermediate     CEFAZOLIN <=4 SENSITIVE Sensitive     CEFTRIAXONE <=1 SENSITIVE Sensitive     CIPROFLOXACIN >=4 RESISTANT Resistant     GENTAMICIN >=16 RESISTANT Resistant     IMIPENEM <=0.25 SENSITIVE Sensitive     NITROFURANTOIN <=16 SENSITIVE Sensitive     TRIMETH/SULFA >=320 RESISTANT Resistant     AMPICILLIN/SULBACTAM 8 SENSITIVE Sensitive     PIP/TAZO <=4 SENSITIVE Sensitive     Extended ESBL NEGATIVE Sensitive     * >=100,000 COLONIES/mL ESCHERICHIA COLI    Candelaria Stagers, PharmD Pharmacy Resident  06/08/2017, 9:17 AM

## 2017-06-23 ENCOUNTER — Encounter: Payer: Medicare Other | Attending: Internal Medicine | Admitting: Dietician

## 2017-06-23 ENCOUNTER — Encounter: Payer: Self-pay | Admitting: Dietician

## 2017-06-23 VITALS — BP 142/88 | Ht 66.0 in | Wt 165.5 lb

## 2017-06-23 DIAGNOSIS — E119 Type 2 diabetes mellitus without complications: Secondary | ICD-10-CM | POA: Insufficient documentation

## 2017-06-23 DIAGNOSIS — Z6826 Body mass index (BMI) 26.0-26.9, adult: Secondary | ICD-10-CM | POA: Diagnosis not present

## 2017-06-23 DIAGNOSIS — Z713 Dietary counseling and surveillance: Secondary | ICD-10-CM | POA: Insufficient documentation

## 2017-06-23 DIAGNOSIS — I10 Essential (primary) hypertension: Secondary | ICD-10-CM | POA: Insufficient documentation

## 2017-06-23 NOTE — Patient Instructions (Signed)
  Check blood sugars 2 x day before breakfast and 2 hrs after lunch  every day and rrecord Bring blood sugar records to the next appointment/class Exercise: walk 30-60 min 3-4 days/wk Eat 3 meals day,   1 snack a day at bedtime Space meals 4-6 hours apart Eat 3 carbohydrate servings/meal + protein Eat 1 carbohydrate serving/snack + protein Avoid sugar sweetened drinks (soda, tea, coffee, sports drinks, fruit juices) Drink plenty of water Limit intake of fried foods and sweets/desserts Make eye doctor appointment Get a Sharps container Return for appointment/classes on:

## 2017-06-23 NOTE — Progress Notes (Signed)
Diabetes Self-Management Education  Visit Type: First/Initial  Appt. Start Time: 0900 Appt. End Time: 5956  06/23/2017  Mr. Terry Long, identified by name and date of birth, is a 67 y.o. male with a diagnosis of Diabetes: Type 2.   ASSESSMENT  Blood pressure (!) 142/88, height 5\' 6"  (1.676 m), weight 165 lb 8 oz (75.1 kg). Body mass index is 26.71 kg/m.  Diabetes Self-Management Education - 06/23/17 1024      Visit Information   Visit Type  First/Initial      Initial Visit   Diabetes Type  Type 2      Health Coping   How would you rate your overall health?  Good      Psychosocial Assessment   Patient Belief/Attitude about Diabetes  Motivated to manage diabetes    Self-care barriers  None    Self-management support  Doctor's office;Family    Other persons present  Patient    Patient Concerns  Glycemic Control    Special Needs  None    Preferred Learning Style  Auditory    Learning Readiness  Ready    What is the last grade level you completed in school?  3rd grade      Pre-Education Assessment   Patient understands the diabetes disease and treatment process.  Needs Instruction    Patient understands incorporating nutritional management into lifestyle.  Needs Instruction    Patient undertands incorporating physical activity into lifestyle.  Needs Instruction    Patient understands using medications safely.  Needs Review    Patient understands monitoring blood glucose, interpreting and using results  Needs Review    Patient understands prevention, detection, and treatment of acute complications.  Needs Instruction    Patient understands prevention, detection, and treatment of chronic complications.  Needs Instruction    Patient understands how to develop strategies to address psychosocial issues.  Needs Instruction    Patient understands how to develop strategies to promote health/change behavior.  Needs Instruction      Complications   How often do you  check your blood sugar?  1-2 times/day    Fasting Blood glucose range (mg/dL)  70-129    Have you had a dilated eye exam in the past 12 months?  Yes 06-2016    Have you had a dental exam in the past 12 months?  Yes 2 months ago    Are you checking your feet?  Yes    How many days per week are you checking your feet?  7      Dietary Intake   Breakfast  eats breakfast at 6a-6:30a=eggs/cheese  with tortilla or toast    Snack (morning)  none    Lunch  eats lunch at 2p-3p=chicken soup, peanut butter/jelly sandwich or eggs with sausage/tortilla    Snack (afternoon)  none    Dinner  usually skips    Snack (evening)  eats occasional fruit or cookie with milk at 6p-7p    Beverage(s)  drinks water 2-3x/day, sweetened tea or coffee 2-3x/day, milk 1x/day      Exercise   Exercise Type  Light (walking / raking leaves)    How many days per week to you exercise?  1.5    How many minutes per day do you exercise?  60    Total minutes per week of exercise  90      Patient Education   Previous Diabetes Education  No    Disease state   Definition of diabetes, type 1  and 2, and the diagnosis of diabetes;Factors that contribute to the development of diabetes    Nutrition management   Role of diet in the treatment of diabetes and the relationship between the three main macronutrients and blood glucose level;Carbohydrate counting;Food label reading, portion sizes and measuring food.    Physical activity and exercise   Role of exercise on diabetes management, blood pressure control and cardiac health.;Helped patient identify appropriate exercises in relation to his/her diabetes, diabetes complications and other health issue.    Medications  Reviewed patients medication for diabetes, action, purpose, timing of dose and side effects.    Monitoring  Purpose and frequency of SMBG.;Yearly dilated eye exam;Taught/discussed recording of test results and interpretation of SMBG.;Identified appropriate SMBG and/or A1C goals.  reviewed use of Accuchek Aviva meter    Chronic complications  Relationship between chronic complications and blood glucose control;Dental care;Retinopathy and reason for yearly dilated eye exams;Reviewed with patient heart disease, higher risk of, and prevention discussed importance of good BG control to lower the risk of complications    Personal strategies to promote health  Lifestyle issues that need to be addressed for better diabetes care;Helped patient develop diabetes management plan for (enter comment)      Outcomes   Expected Outcomes  Demonstrated interest in learning. Expect positive outcomes    Program Status  Not Completed       Individualized Plan for Diabetes Self-Management Training:   Learning Objective:  Patient will have a greater understanding of diabetes self-management. Patient education plan is to attend individual and/or group sessions per assessed needs and concerns.   Plan:   Check blood sugars 2 x day before breakfast and 2 hrs after lunch  every day and rrecord Bring blood sugar records to the next appointment/class Exercise: walk 30-60 min 3-4 days/wk Eat 3 meals day,   1 snack a day at bedtime Space meals 4-6 hours apart Eat 3 carbohydrate servings/meal + protein Eat 1 carbohydrate serving/snack + protein Avoid sugar sweetened drinks (soda, tea, coffee, sports drinks, fruit juices) Drink plenty of water Limit intake of fried foods and sweets/desserts Make eye doctor appointment Get a Sharps container Return for appointment/classes on: call us  if desires to schedule follow up-referral valid for 1 year  Expected Outcomes:  Demonstrated interest in learning. Expect positive outcomes  Education material provided: General meal planning guidelines  If problems or questions, patient to contact team via:  [phone], [phone]  Future DSME appointment: patient does not want to schedule any followup

## 2017-07-23 ENCOUNTER — Encounter: Payer: Self-pay | Admitting: Urology

## 2017-07-23 ENCOUNTER — Ambulatory Visit: Payer: Medicare Other | Admitting: Urology

## 2017-07-23 VITALS — BP 167/77 | HR 52 | Resp 16 | Ht 66.0 in | Wt 170.0 lb

## 2017-07-23 DIAGNOSIS — R350 Frequency of micturition: Secondary | ICD-10-CM | POA: Diagnosis not present

## 2017-07-23 DIAGNOSIS — N401 Enlarged prostate with lower urinary tract symptoms: Secondary | ICD-10-CM

## 2017-07-23 LAB — URINALYSIS, COMPLETE
BILIRUBIN UA: NEGATIVE
Glucose, UA: NEGATIVE
Ketones, UA: NEGATIVE
Leukocytes, UA: NEGATIVE
Nitrite, UA: NEGATIVE
PH UA: 7 (ref 5.0–7.5)
Protein, UA: NEGATIVE
Specific Gravity, UA: 1.02 (ref 1.005–1.030)
UUROB: 0.2 mg/dL (ref 0.2–1.0)

## 2017-07-23 LAB — MICROSCOPIC EXAMINATION: WBC UA: NONE SEEN /HPF (ref 0–5)

## 2017-07-23 MED ORDER — OXYBUTYNIN CHLORIDE 5 MG PO TABS: ORAL_TABLET | ORAL | 0 refills | Status: DC

## 2017-07-23 NOTE — Progress Notes (Signed)
07/23/2017 10:06 AM   Terry Long [birth year] [id number]  Referring provider: No referring provider defined for this encounter.  Chief Complaint  Patient presents with  . Benign Prostatic Hypertrophy    HPI: 67 year old male who presents to establish urologic care.  He has a long history of BPH and has most recently been followed by Dr. Jacqlyn Larsen.  He last saw Dr. Jacqlyn Larsen in March 2018.  Past urologic history: -PVP 2005 -TURP 2011 at [facility] -TURP 07/2012 by Dr. Jacqlyn Larsen in [place] of a prostatic urethral calculus, internal urethrotomy and TURP 03/2013 by Dr. Jacqlyn Larsen  Since his last visit with Dr. Jacqlyn Larsen he has stable lower urinary tract symptoms.  He has nocturia approximately every 2 hours.  He voids with a good stream.  Denies dysuria or gross hematuria.  Denies history of snoring or sleep apnea.  He has limited his nighttime fluid intake.  Last PSA March 2018 1.34.   PMH: Past Medical History:  Diagnosis Date  . GERD (gastroesophageal reflux disease)   . Hypertension   . Peptic ulcer     Surgical History: Past Surgical History:  Procedure Laterality Date  . COLONOSCOPY    . COLONOSCOPY N/A 10/24/2016   Procedure: COLONOSCOPY;  Surgeon: Manya Silvas, MD;  Location: [facility] ENDOSCOPY;  Service: Endoscopy;  Laterality: N/A;  . ESOPHAGOGASTRODUODENOSCOPY    . ESOPHAGOGASTRODUODENOSCOPY (EGD) WITH PROPOFOL N/A 10/24/2016   Procedure: ESOPHAGOGASTRODUODENOSCOPY (EGD) WITH PROPOFOL;  Surgeon: Manya Silvas, MD;  Location: [facility] ENDOSCOPY;  Service: Endoscopy;  Laterality: N/A;  . PROSTATE SURGERY      Home Medications:  Allergies as of 07/23/2017   No Known Allergies     Medication List        Accurate as of 07/23/17 10:06 AM. Always use your most recent med list.          amLODipine 5 MG tablet Commonly known as:  NORVASC Take 5 mg by mouth daily.   aspirin EC 81 MG tablet Take 1 tablet by mouth daily.   atorvastatin 40 MG  tablet Commonly known as:  LIPITOR Take 1 tablet by mouth daily.   fluticasone 50 MCG/ACT nasal spray Commonly known as:  FLONASE Place 1 spray into both nostrils 2 (two) times daily.   losartan 50 MG tablet Commonly known as:  COZAAR Take 1 tablet by mouth daily.   metFORMIN 500 MG tablet Commonly known as:  GLUCOPHAGE Take 1 tablet (500 mg total) by mouth 2 (two) times daily with a meal.   omeprazole 40 MG capsule Commonly known as:  PRILOSEC Take 40 mg by mouth 2 (two) times daily.   polyethylene glycol powder powder Commonly known as:  GLYCOLAX/MIRALAX Take 1 Container by mouth once.   sucralfate 1 g tablet Commonly known as:  CARAFATE Take 1 g by mouth 4 (four) times daily -  with meals and at bedtime.       Allergies: No Known Allergies  Family History: History reviewed. No pertinent family history.  Social History:  reports that he has never smoked. He has never used smokeless tobacco. He reports that he does not drink alcohol or use drugs.  ROS: UROLOGY Frequent Urination?: Yes Hard to postpone urination?: No Burning/pain with urination?: No Get up at night to urinate?: Yes Leakage of urine?: No Urine stream starts and stops?: No Trouble starting stream?: No Do you have to strain to urinate?: No Blood in urine?: No Urinary tract infection?: No Sexually transmitted disease?: No Injury to kidneys or  bladder?: No Painful intercourse?: No Weak stream?: No Erection problems?: No Penile pain?: No  Gastrointestinal Nausea?: No Vomiting?: No Indigestion/heartburn?: Yes Diarrhea?: No Constipation?: No  Constitutional Fever: No Night sweats?: No Weight loss?: No Fatigue?: No  Skin Skin rash/lesions?: No Itching?: No  Eyes Blurred vision?: No Double vision?: No  Ears/Nose/Throat Sore throat?: No Sinus problems?: No  Hematologic/Lymphatic Swollen glands?: No Easy bruising?: No  Cardiovascular Leg swelling?: No Chest pain?:  No  Respiratory Cough?: No Shortness of breath?: No  Endocrine Excessive thirst?: No  Musculoskeletal Back pain?: No Joint pain?: No  Neurological Headaches?: No Dizziness?: No  Psychologic Depression?: No Anxiety?: No  Physical Exam: BP (!) 167/77   Pulse (!) 52   Resp 16   Ht 5\' 6"  (1.676 m)   Wt 170 lb (77.1 kg)   SpO2 96%   BMI 27.44 kg/m   Constitutional:  Alert and oriented, No acute distress. HEENT: [place] AT, moist mucus membranes.  Trachea midline, no masses. Cardiovascular: No clubbing, cyanosis, or edema. Respiratory: Normal respiratory effort, no increased work of breathing. GI: Abdomen is soft, nontender, nondistended, no abdominal masses GU: No CVA tenderness.  Prostate 50 g, smooth without nodules Lymph: No cervical or inguinal lymphadenopathy. Skin: No rashes, bruises or suspicious lesions. Neurologic: Grossly intact, no focal deficits, moving all 4 extremities. Psychiatric: Normal mood and affect.  Laboratory Data:  Urinalysis Dipstick trace blood; microscopy negative   Assessment & Plan:   67 year old male with stable lower urinary tract symptoms.  His nocturia is bothersome.  He was interested in a trial of medical management.  Trial immediate release oxybutynin 5 mg at bedtime.  PSA was drawn.   Abbie Sons, [facility] [address] [place], [place] [address] [phone]

## 2017-07-24 LAB — PSA: Prostate Specific Ag, Serum: 2 ng/mL (ref 0.0–4.0)

## 2017-07-27 ENCOUNTER — Telehealth: Payer: Self-pay

## 2017-07-27 NOTE — Telephone Encounter (Signed)
Pt informed

## 2017-07-27 NOTE — Telephone Encounter (Signed)
-----   Message from Abbie Sons, MD sent at 07/25/2017  2:26 PM EDT ----- PSA stable at 2.0

## 2017-08-05 ENCOUNTER — Encounter: Payer: Self-pay | Admitting: Urology

## 2017-08-05 ENCOUNTER — Ambulatory Visit: Payer: Medicare Other | Admitting: Urology

## 2017-08-05 VITALS — BP 152/71 | HR 96 | Ht 66.0 in | Wt 169.8 lb

## 2017-08-05 DIAGNOSIS — N453 Epididymo-orchitis: Secondary | ICD-10-CM

## 2017-08-05 DIAGNOSIS — N452 Orchitis: Secondary | ICD-10-CM

## 2017-08-05 DIAGNOSIS — R350 Frequency of micturition: Secondary | ICD-10-CM | POA: Diagnosis not present

## 2017-08-05 DIAGNOSIS — N401 Enlarged prostate with lower urinary tract symptoms: Secondary | ICD-10-CM | POA: Diagnosis not present

## 2017-08-05 DIAGNOSIS — N451 Epididymitis: Secondary | ICD-10-CM | POA: Diagnosis not present

## 2017-08-05 LAB — URINALYSIS, COMPLETE
Bilirubin, UA: NEGATIVE
Glucose, UA: NEGATIVE
Ketones, UA: NEGATIVE
Leukocytes, UA: NEGATIVE
Nitrite, UA: NEGATIVE
PH UA: 7 (ref 5.0–7.5)
SPEC GRAV UA: 1.02 (ref 1.005–1.030)
Urobilinogen, Ur: 0.2 mg/dL (ref 0.2–1.0)

## 2017-08-05 LAB — MICROSCOPIC EXAMINATION
BACTERIA UA: NONE SEEN
RBC, UA: NONE SEEN /hpf (ref 0–2)

## 2017-08-05 MED ORDER — ETODOLAC 400 MG PO TABS: 400.0000 mg | ORAL_TABLET | Freq: Two times a day (BID) | ORAL | 0 refills | Status: AC

## 2017-08-05 MED ORDER — DOXYCYCLINE HYCLATE 100 MG PO CAPS: 100.0000 mg | ORAL_CAPSULE | Freq: Two times a day (BID) | ORAL | 0 refills | Status: AC

## 2017-08-05 NOTE — Progress Notes (Signed)
08/05/2017 9:04 AM   Sande Rives Meza July 17, 1950 [id number]  Referring provider: Tracie Harrier, MD [address] [facility] [place], [place] [address]  Chief Complaint  Patient presents with  . Testicle Pain  . Benign Prostatic Hypertrophy    HPI: 67 year old male presents with a 2-day history of left hemiscrotal pain and swelling.  He had a subjective fever last night.  He denies nausea, vomiting or dysuria.  He does have baseline obstructive voiding symptoms.  He was seen in the ED in late May complaining of similar symptoms.  A urine culture grew E. coli.  He had been treated with both Septra and Levaquin however the organism was resistant to both of these antibiotics.  He subsequently completed a 10-day course of Keflex with resolution of his symptoms.   PMH: Past Medical History:  Diagnosis Date  . GERD (gastroesophageal reflux disease)   . Hypertension   . Peptic ulcer     Surgical History: Past Surgical History:  Procedure Laterality Date  . COLONOSCOPY    . COLONOSCOPY N/A 10/24/2016   Procedure: COLONOSCOPY;  Surgeon: Manya Silvas, MD;  Location: [facility] ENDOSCOPY;  Service: Endoscopy;  Laterality: N/A;  . ESOPHAGOGASTRODUODENOSCOPY    . ESOPHAGOGASTRODUODENOSCOPY (EGD) WITH PROPOFOL N/A 10/24/2016   Procedure: ESOPHAGOGASTRODUODENOSCOPY (EGD) WITH PROPOFOL;  Surgeon: Manya Silvas, MD;  Location: [facility] ENDOSCOPY;  Service: Endoscopy;  Laterality: N/A;  . PROSTATE SURGERY      Home Medications:  Allergies as of 08/05/2017   No Known Allergies     Medication List        Accurate as of 08/05/17  9:04 AM. Always use your most recent med list.          amLODipine 5 MG tablet Commonly known as:  NORVASC Take 5 mg by mouth daily.   aspirin EC 81 MG tablet Take 1 tablet by mouth daily.   atorvastatin 40 MG tablet Commonly known as:  LIPITOR Take 1 tablet by mouth daily.   fluticasone 50 MCG/ACT nasal spray Commonly  known as:  FLONASE Place 1 spray into both nostrils 2 (two) times daily.   losartan 50 MG tablet Commonly known as:  COZAAR Take 1 tablet by mouth daily.   metFORMIN 500 MG tablet Commonly known as:  GLUCOPHAGE Take 1 tablet (500 mg total) by mouth 2 (two) times daily with a meal.   omeprazole 40 MG capsule Commonly known as:  PRILOSEC Take 40 mg by mouth 2 (two) times daily.   oxybutynin 5 MG tablet Commonly known as:  DITROPAN 1 tablet at bedtime   polyethylene glycol powder powder Commonly known as:  GLYCOLAX/MIRALAX Take 1 Container by mouth once.   sucralfate 1 g tablet Commonly known as:  CARAFATE Take 1 g by mouth 4 (four) times daily -  with meals and at bedtime.       Allergies: No Known Allergies  Family History: No family history on file.  Social History:  reports that he has never smoked. He has never used smokeless tobacco. He reports that he does not drink alcohol or use drugs.  ROS: UROLOGY Frequent Urination?: Yes Hard to postpone urination?: Yes Burning/pain with urination?: No Get up at night to urinate?: Yes Leakage of urine?: No Urine stream starts and stops?: Yes Trouble starting stream?: Yes Do you have to strain to urinate?: Yes Blood in urine?: No Urinary tract infection?: No Sexually transmitted disease?: No Injury to kidneys or bladder?: No Painful intercourse?: No Weak stream?:  No Erection problems?: No Penile pain?: No  Gastrointestinal Nausea?: No Vomiting?: No Indigestion/heartburn?: No Diarrhea?: No Constipation?: No  Constitutional Fever: No Night sweats?: No Weight loss?: No Fatigue?: Yes  Skin Skin rash/lesions?: No Itching?: No  Eyes Blurred vision?: No Double vision?: No  Ears/Nose/Throat Sore throat?: No Sinus problems?: No  Hematologic/Lymphatic Swollen glands?: No Easy bruising?: No  Cardiovascular Leg swelling?: No Chest pain?: No  Respiratory Cough?: No Shortness of breath?:  No  Endocrine Excessive thirst?: No  Musculoskeletal Back pain?: Yes Joint pain?: No  Neurological Headaches?: Yes Dizziness?: No  Psychologic Depression?: No Anxiety?: No  Physical Exam: BP (!) 152/71 (BP Location: Left Arm, Patient Position: Sitting, Cuff Size: Normal)   Pulse 96   Ht 5\' 6"  (1.676 m)   Wt 169 lb 12.8 oz (77 kg)   BMI 27.41 kg/m   Constitutional:  Alert and oriented, No acute distress. HEENT: [place] AT, moist mucus membranes.  Trachea midline, no masses. Cardiovascular: No clubbing, cyanosis, or edema. Respiratory: Normal respiratory effort, no increased work of breathing. GI: Abdomen is soft, nontender, nondistended, no abdominal masses GU: No CVA tenderness.  Left testis/epididymis slightly enlarged and tender.  No significant skin erythema or fluctuance. Lymph: No cervical or inguinal lymphadenopathy. Skin: No rashes, bruises or suspicious lesions. Neurologic: Grossly intact, no focal deficits, moving all 4 extremities. Psychiatric: Normal mood and affect.  Laboratory Data:  Urinalysis Dipstick trace blood/microscopy negative   Assessment & Plan:   67 year old male with left epididymo-orchitis.  This most likely represent a persistent rather than recurrent infection.  Urine culture was ordered.  Rx cefuroxime was sent to pharmacy along with an NSAID.  Follow-up 6 weeks and he was instructed to call earlier for worsening symptoms.   Return in about 6 weeks (around 09/16/2017) for Recheck, Bladder scan.   Abbie Sons, [place] [address] [place], [place] [address] [phone]

## 2017-08-07 ENCOUNTER — Encounter: Payer: Self-pay | Admitting: Urology

## 2017-08-08 LAB — CULTURE, URINE COMPREHENSIVE

## 2017-09-11 DIAGNOSIS — I10 Essential (primary) hypertension: Secondary | ICD-10-CM | POA: Insufficient documentation

## 2017-09-11 DIAGNOSIS — R739 Hyperglycemia, unspecified: Secondary | ICD-10-CM | POA: Insufficient documentation

## 2017-09-17 ENCOUNTER — Ambulatory Visit: Payer: Medicare Other | Admitting: Urology

## 2018-01-14 ENCOUNTER — Other Ambulatory Visit: Payer: Self-pay

## 2018-01-14 ENCOUNTER — Encounter: Payer: Self-pay | Admitting: *Deleted

## 2018-01-14 ENCOUNTER — Emergency Department
Admission: EM | Admit: 2018-01-14 | Discharge: 2018-01-14 | Disposition: A | Discharge status: Home / Self Care | Payer: PPO | Attending: Emergency Medicine | Admitting: Emergency Medicine

## 2018-01-14 DIAGNOSIS — R001 Bradycardia, unspecified: Secondary | ICD-10-CM

## 2018-01-14 DIAGNOSIS — Z7982 Long term (current) use of aspirin: Secondary | ICD-10-CM | POA: Insufficient documentation

## 2018-01-14 DIAGNOSIS — Z79899 Other long term (current) drug therapy: Secondary | ICD-10-CM | POA: Insufficient documentation

## 2018-01-14 DIAGNOSIS — Z7984 Long term (current) use of oral hypoglycemic drugs: Secondary | ICD-10-CM | POA: Insufficient documentation

## 2018-01-14 DIAGNOSIS — S134XXA Sprain of ligaments of cervical spine, initial encounter: Secondary | ICD-10-CM

## 2018-01-14 DIAGNOSIS — Y92410 Unspecified street and highway as the place of occurrence of the external cause: Secondary | ICD-10-CM | POA: Diagnosis not present

## 2018-01-14 DIAGNOSIS — Y9389 Activity, other specified: Secondary | ICD-10-CM | POA: Insufficient documentation

## 2018-01-14 DIAGNOSIS — S199XXA Unspecified injury of neck, initial encounter: Secondary | ICD-10-CM | POA: Diagnosis present

## 2018-01-14 DIAGNOSIS — I10 Essential (primary) hypertension: Secondary | ICD-10-CM | POA: Diagnosis not present

## 2018-01-14 DIAGNOSIS — Y999 Unspecified external cause status: Secondary | ICD-10-CM | POA: Insufficient documentation
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
## 2018-01-14 NOTE — ED Notes (Signed)
EDP Schaevitz at bedside assessing & talking with pt.
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
## 2018-01-14 NOTE — ED Notes (Addendum)
Grn/lav top drawn and sent to lab.

## 2018-01-14 NOTE — ED Notes (Signed)
Pt's son at bedside

## 2018-01-14 NOTE — ED Notes (Signed)
Pt moved to room 13.  Report off to georgie rn
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
## 2018-02-04 DIAGNOSIS — J208 Acute bronchitis due to other specified organisms: Secondary | ICD-10-CM | POA: Diagnosis not present

## 2018-02-11 DIAGNOSIS — M47814 Spondylosis without myelopathy or radiculopathy, thoracic region: Secondary | ICD-10-CM | POA: Diagnosis not present

## 2018-02-11 DIAGNOSIS — H40153 Residual stage of open-angle glaucoma, bilateral: Secondary | ICD-10-CM | POA: Diagnosis not present

## 2018-02-11 DIAGNOSIS — M546 Pain in thoracic spine: Secondary | ICD-10-CM | POA: Diagnosis not present

## 2018-02-11 DIAGNOSIS — R918 Other nonspecific abnormal finding of lung field: Secondary | ICD-10-CM | POA: Diagnosis not present

## 2018-02-19 DIAGNOSIS — G44209 Tension-type headache, unspecified, not intractable: Secondary | ICD-10-CM | POA: Diagnosis not present

## 2018-02-19 DIAGNOSIS — I10 Essential (primary) hypertension: Secondary | ICD-10-CM | POA: Diagnosis not present

## 2018-03-17 DIAGNOSIS — Z0000 Encounter for general adult medical examination without abnormal findings: Secondary | ICD-10-CM | POA: Diagnosis not present

## 2018-03-17 DIAGNOSIS — I10 Essential (primary) hypertension: Secondary | ICD-10-CM | POA: Diagnosis not present

## 2018-03-17 DIAGNOSIS — K5900 Constipation, unspecified: Secondary | ICD-10-CM | POA: Diagnosis not present

## 2018-03-17 DIAGNOSIS — R739 Hyperglycemia, unspecified: Secondary | ICD-10-CM | POA: Diagnosis not present

## 2018-04-14 ENCOUNTER — Telehealth: Payer: Self-pay | Admitting: Urology

## 2018-04-14 DIAGNOSIS — R310 Gross hematuria: Secondary | ICD-10-CM

## 2018-04-14 NOTE — Telephone Encounter (Signed)
Pt LMOM and wants to speak to [facility].  He didn't say what he wanted.

## 2018-04-14 NOTE — Telephone Encounter (Signed)
ID #[id number] disconnected  ID# [id number]  Spoke with patient and he states he has been passing blood for a few days and some clots were noted in the AM today. He did have burning a few days ago when the initial clot was passed and blood noted but is now having no other urinary symptoms except blood. How should he proceed?

## 2018-04-15 ENCOUNTER — Other Ambulatory Visit: Payer: PPO

## 2018-04-15 ENCOUNTER — Other Ambulatory Visit: Payer: Self-pay

## 2018-04-15 DIAGNOSIS — R310 Gross hematuria: Secondary | ICD-10-CM

## 2018-04-15 LAB — URINALYSIS, COMPLETE
Bilirubin, UA: NEGATIVE
Glucose, UA: NEGATIVE
Ketones, UA: NEGATIVE
Leukocytes,UA: NEGATIVE
Nitrite, UA: NEGATIVE
Protein,UA: NEGATIVE
Specific Gravity, UA: 1.02 (ref 1.005–1.030)
Urobilinogen, Ur: 0.2 mg/dL (ref 0.2–1.0)
pH, UA: 6 (ref 5.0–7.5)

## 2018-04-15 LAB — MICROSCOPIC EXAMINATION
Bacteria, UA: NONE SEEN
Epithelial Cells (non renal): NONE SEEN /hpf (ref 0–10)
RBC, Urine: NONE SEEN /hpf (ref 0–2)
WBC, UA: NONE SEEN /hpf (ref 0–5)

## 2018-04-15 NOTE — Telephone Encounter (Signed)
PJ#[id number]  Patient notified will stop by office today for UA and Culture will call with results

## 2018-04-15 NOTE — Telephone Encounter (Signed)
recc a urine culture, if neg will need to sched CTU and cysto.  Can be done at hospital lab if needed

## 2018-04-18 LAB — CULTURE, URINE COMPREHENSIVE

## 2018-07-12 DIAGNOSIS — H40153 Residual stage of open-angle glaucoma, bilateral: Secondary | ICD-10-CM | POA: Diagnosis not present

## 2018-07-20 ENCOUNTER — Encounter: Payer: Self-pay | Admitting: Urology

## 2018-07-20 ENCOUNTER — Other Ambulatory Visit: Payer: Self-pay

## 2018-07-20 ENCOUNTER — Ambulatory Visit: Payer: PPO | Admitting: Urology

## 2018-07-20 VITALS — BP 136/55 | HR 43 | Ht 66.0 in | Wt 169.1 lb

## 2018-07-20 DIAGNOSIS — R310 Gross hematuria: Secondary | ICD-10-CM | POA: Diagnosis not present

## 2018-07-20 NOTE — Progress Notes (Signed)
07/20/2018 2:12 PM   [employer] Meza July 31, 1950 [id number]  Referring provider: Tracie Harrier, [place] [facility] [place],  [place] [address]  Chief Complaint  Patient presents with  . Follow-up    Urologic history: 1.  BPH with lower urinary tract symptoms  -PVP 2005  -TURP 2011 at [facility]  -TURP 07/2012 by Dr. Jacqlyn Larsen in [place] of a prostatic urethral calculus, internal urethrotomy and TURP 03/2013 by Dr. Jacqlyn Larsen  HPI: 68 year old male presents for annual follow-up.  When I saw him last July he had stable lower urinary tract symptoms and was doing well.    He called in early April 2020 complaining of urinary frequency, urgency and hematuria with clots.  He had mild dysuria.  He was asked to bring in a urine specimen which showed 1+ blood but no significant abnormalities on microscopy.  A urine culture was ordered which was negative.  He states his symptoms resolved after 3 days however 2 weeks ago he noted a small amount of blood in his urine with intermittent small clots which lasted less than 24 hours.   PMH: Past Medical History:  Diagnosis Date  . GERD (gastroesophageal reflux disease)   . Hypertension   . Peptic ulcer     Surgical History: Past Surgical History:  Procedure Laterality Date  . COLONOSCOPY    . COLONOSCOPY N/A 10/24/2016   Procedure: COLONOSCOPY;  Surgeon: Manya Silvas, MD;  Location: [facility] ENDOSCOPY;  Service: Endoscopy;  Laterality: N/A;  . ESOPHAGOGASTRODUODENOSCOPY    . ESOPHAGOGASTRODUODENOSCOPY (EGD) WITH PROPOFOL N/A 10/24/2016   Procedure: ESOPHAGOGASTRODUODENOSCOPY (EGD) WITH PROPOFOL;  Surgeon: Manya Silvas, MD;  Location: [facility] ENDOSCOPY;  Service: Endoscopy;  Laterality: N/A;  . PROSTATE SURGERY      Home Medications:  Allergies as of 07/20/2018   No Known Allergies     Medication List       Accurate as of July 20, 2018  2:12 PM. If you have any questions, ask your nurse  or doctor.        amLODipine 5 MG tablet Commonly known as: NORVASC Take 5 mg by mouth daily.   aspirin EC 81 MG tablet Take 1 tablet by mouth daily.   atorvastatin 40 MG tablet Commonly known as: LIPITOR Take 1 tablet by mouth daily.   fluticasone 50 MCG/ACT nasal spray Commonly known as: FLONASE Place 1 spray into both nostrils 2 (two) times daily.   losartan 50 MG tablet Commonly known as: COZAAR Take 1 tablet by mouth daily.   metFORMIN 500 MG tablet Commonly known as: Glucophage Take 1 tablet (500 mg total) by mouth 2 (two) times daily with a meal.   omeprazole 40 MG capsule Commonly known as: PRILOSEC Take 40 mg by mouth 2 (two) times daily.   oxybutynin 5 MG tablet Commonly known as: DITROPAN 1 tablet at bedtime   polyethylene glycol powder 17 GM/SCOOP powder Commonly known as: GLYCOLAX/MIRALAX Take 1 Container by mouth once.   sucralfate 1 g tablet Commonly known as: CARAFATE Take 1 g by mouth 4 (four) times daily -  with meals and at bedtime.       Allergies: No Known Allergies  Family History: No family history on file.  Social History:  reports that he has never smoked. He has never used smokeless tobacco. He reports that he does not drink alcohol or use drugs.  ROS: UROLOGY Frequent Urination?: No Hard to postpone urination?: No Burning/pain with urination?: No Get up  at night to urinate?: Yes Leakage of urine?: No Urine stream starts and stops?: Yes Trouble starting stream?: No Do you have to strain to urinate?: No Blood in urine?: No Urinary tract infection?: No Sexually transmitted disease?: No Injury to kidneys or bladder?: No Painful intercourse?: No Weak stream?: No Erection problems?: No Penile pain?: No  Gastrointestinal Nausea?: No Vomiting?: No Indigestion/heartburn?: No Diarrhea?: No Constipation?: Yes  Constitutional Fever: No Night sweats?: No Weight loss?: No Fatigue?: No  Skin Skin rash/lesions?: No  Itching?: No  Eyes Blurred vision?: No Double vision?: No  Ears/Nose/Throat Sore throat?: No Sinus problems?: No  Hematologic/Lymphatic Swollen glands?: No Easy bruising?: No  Cardiovascular Leg swelling?: No Chest pain?: No  Respiratory Cough?: No Shortness of breath?: No  Endocrine Excessive thirst?: No  Musculoskeletal Back pain?: No Joint pain?: No  Neurological Headaches?: No Dizziness?: No  Psychologic Depression?: No Anxiety?: No  Physical Exam: BP (!) 136/55 (BP Location: Left Arm, Patient Position: Sitting, Cuff Size: Normal)   Pulse (!) 43   Ht 5\' 6"  (1.676 m)   Wt 169 lb 1.6 oz (76.7 kg)   BMI 27.29 kg/m   Constitutional:  Alert and oriented, No acute distress. HEENT: [place] AT, moist mucus membranes.  Trachea midline, no masses. Cardiovascular: No clubbing, cyanosis, or edema. Respiratory: Normal respiratory effort, no increased work of breathing. GI: Abdomen is soft, nontender, nondistended, no abdominal masses GU: No CVA tenderness Lymph: No cervical or inguinal lymphadenopathy. Skin: No rashes, bruises or suspicious lesions. Neurologic: Grossly intact, no focal deficits, moving all 4 extremities. Psychiatric: Normal mood and affect.   Assessment & Plan:   68 year old male with intermittent gross hematuria with irritative voiding symptoms and a negative urine culture.  Recommended proceeding with CT urogram and cystoscopy.  Patient was in agreement and desires to schedule.   Abbie Sons, [place] [address] [place], [place] [address] [phone]

## 2018-08-17 ENCOUNTER — Other Ambulatory Visit: Payer: Self-pay

## 2018-08-17 ENCOUNTER — Encounter: Payer: Self-pay | Admitting: Urology

## 2018-08-17 ENCOUNTER — Ambulatory Visit: Payer: PPO | Admitting: Urology

## 2018-08-17 VITALS — BP 164/84 | HR 69 | Ht 66.0 in | Wt 166.8 lb

## 2018-08-17 DIAGNOSIS — N401 Enlarged prostate with lower urinary tract symptoms: Secondary | ICD-10-CM

## 2018-08-17 DIAGNOSIS — R310 Gross hematuria: Secondary | ICD-10-CM

## 2018-08-17 LAB — URINALYSIS, COMPLETE
Bilirubin, UA: NEGATIVE
Glucose, UA: NEGATIVE
Ketones, UA: NEGATIVE
Nitrite, UA: NEGATIVE
Protein,UA: NEGATIVE
Specific Gravity, UA: 1.025 (ref 1.005–1.030)
Urobilinogen, Ur: 0.2 mg/dL (ref 0.2–1.0)
pH, UA: 6 (ref 5.0–7.5)

## 2018-08-17 LAB — MICROSCOPIC EXAMINATION

## 2018-08-17 MED ORDER — LIDOCAINE HCL URETHRAL/MUCOSAL 2 % EX GEL
1.0000 "application " | Freq: Once | CUTANEOUS | Status: AC
  Administered 2018-08-17: 1 via URETHRAL

## 2018-08-17 MED ORDER — FINASTERIDE 5 MG PO TABS: 5.0000 mg | ORAL_TABLET | Freq: Every day | ORAL | 11 refills | Status: DC

## 2018-08-17 NOTE — Progress Notes (Signed)
   08/17/18  CC:  Chief Complaint  Patient presents with  . Cysto    HPI: Refer to my prior note dated 07/20/2018.  He denies recurrent gross hematuria.  He did not have his CTU performed prior to cystoscopy.  Blood pressure (!) 164/84, pulse 69, height 5\' 6"  (1.676 m), weight 166 lb 12.8 oz (75.7 kg). NED. A&Ox3.   No respiratory distress   Abd soft, NT, ND Normal phallus with bilateral descended testicles  Cystoscopy Procedure Note  Patient identification was confirmed, informed consent was obtained, and patient was prepped using Betadine solution.  Lidocaine jelly was administered per urethral meatus.     Pre-Procedure: - Inspection reveals a normal caliber ureteral meatus.  Procedure: The flexible cystoscope was introduced without difficulty - No urethral strictures/lesions are present. - Coapting lateral lobes distal prostatic urethra; proximal prostatic urethra with mild adenoma regrowth but bladder neck wide open  - Moderate elevation bladder neck; prominent hypervascularity - Bilateral ureteral orifices identified - Bladder mucosa  reveals no ulcers, tumors, or lesions - No bladder stones -Moderate trabeculation  Retroflexion shows hypervascularity bladder neck   Post-Procedure: - Patient tolerated the procedure well  Assessment/ Plan: - Hematuria most likely secondary to prostatic bleeding. - Schedule CTU to evaluate upper tracts - Finasteride 5 mg daily - Follow-up 2 months   Abbie Sons, MD

## 2018-08-17 NOTE — Patient Instructions (Signed)
Exploracin por tomografa computarizada (TC) CT Scan  Una exploracin por TC (exploracin por tomografa computarizada) es un estudio de diagnstico por imgenes. [place] radiografas y [place] computadora para tomar imgenes detalladas de diferentes zonas del interior del cuerpo. Una exploracin por TC puede brindar ms informacin que un examen radiogrfico normal. Una exploracin por TC proporciona [employer]ck [employer] rganos internos, las estructuras de los tejidos blandos, los vasos sanguneos y [place]. En este procedimiento, las imgenes se toman en una gran mquina que posee una abertura (tomgrafo). Informe al mdico acerca de lo siguiente:  Cualquier alergia que tenga.  Todos los [employer] Corporation consume, como vitaminas, hierbas, gotas oftlmicas, cremas y medicamentos de venta libre.  Cualquier enfermedad de la sangre que tenga.  Cirugas previas a las que se someti.  Cualquier enfermedad que tenga.  Si est embarazada o podra estarlo. Cules son los riesgos? En general, se trata de un procedimiento seguro. Sin embargo, pueden ocurrir complicaciones, por ejemplo:  Nurse, mental health a los tintes.  Aparicin de cncer por exposicin excesiva a la radiacin de mltiples exploraciones por TC. Esto es raro. Qu ocurre antes del procedimiento? Mantenerse hidratado Siga las indicaciones del mdico acerca de la hidratacin, las cuales pueden incluir lo siguiente:  Hasta 2horas antes del procedimiento, puede beber lquidos transparentes, como agua, jugos frutales transparentes, caf negro y t solo. Restricciones en las comidas y bebidas [place] comidas y bebidas, las cuales pueden incluir lo siguiente:  Veinticuatro horas antes del procedimiento, no consuma bebidas con cafena, como bebidas energizantes, t, gaseosa, caf y chocolate caliente.  Ocho horas antes del procedimiento, no ingiera comidas o alimentos pesados, por ejemplo,  carne, alimentos fritos o alimentos grasos.  Seis horas antes del procedimiento, no ingiera comidas o alimentos livianos, como tostadas o cereales.  Seis horas antes del procedimiento, no beba [place] o bebidas que [place].  Dos horas antes del procedimiento, no beba lquidos transparentes. Instrucciones generales  Qutese todas las joyas.  Consulte a su mdico si debe cambiar o suspender los medicamentos que toma habitualmente. Esto es muy importante si toma medicamentos para la diabetes o anticoagulantes. Qu ocurre durante el procedimiento?  Deber recostarse sobre una camilla con los brazos por encima de la cabeza.  Pueden colocarle un tubo (catter) intravenoso en una de las venas.  El tinte de contraste puede inyectarse en el tubo (catter) intravenoso. Es posible que sienta calor o un sabor metlico en la boca.  La camilla sobre la cual estar recostado se desplazar hacia adentro del tomgrafo.  Podr ver, Pharmacist, community y hablar con la persona que [place] el equipo mientras est dentro de [place]. Siga las instrucciones de esa persona.  El tomgrafo se mover alrededor suyo para [employer]. No se mueva mientras est en funcionamiento. Mantenerse inmvil ayuda al tomgrafo a obtener una buena imagen.  Cuando se hayan tomado las mejores imgenes posibles, se apagar el equipo. La camilla se desplazar fuera de este.  Se retirar el tubo (catter) intravenoso. Este procedimiento puede variar segn el mdico y el hospital. Sander Nephew sucede despus del procedimiento?  Es su responsabilidad retirar [employer] del procedimiento. Pregntele al mdico o a alguien del departamento donde se realice el procedimiento cundo estarn [employer]. Resumen  Una exploracin por tomografa computarizada (TC) es un estudio de diagnstico por imgenes.  Ardelia Mems exploracin por TC utiliza radiografas y [place] computadora para tomar imgenes detalladas de diferentes zonas del cuerpo.   [place]  del mdico con respecto a lo que puede comer y beber antes del procedimiento.  Podr ver, Pharmacist, community y hablar con la persona que [place] el equipo mientras est dentro de [place]. Siga las instrucciones de esa persona. Esta informacin no tiene Marine scientist el consejo del mdico. Asegrese de hacerle al mdico cualquier pregunta que tenga. Document Released: 12/23/2004 Document Revised: 04/14/2016 Document Reviewed: 04/14/2016 Elsevier Patient Education  [place]. CT Scan  A CT scan (computed tomography scan) is an imaging scan. It uses X-rays and a computer to make detailed pictures of different areas inside the body. A CT scan can give more information than a regular X-ray exam. A CT scan provides data about internal organs, soft tissue structures, blood vessels, and bones. In this procedure, the pictures will be taken in a large machine that has an opening (CT scanner). Tell a health care provider about:  Any allergies you have.  All medicines you are taking, including vitamins, herbs, eye drops, creams, and over-the-counter medicines.  Any blood disorders you have.  Any surgeries you have had.  Any medical conditions you have.  Whether you are pregnant or may be pregnant. What are the risks? Generally, this is a safe procedure. However, problems may occur, including:  An allergic reaction to dyes.  Development of cancer from excessive exposure to radiation from multiple CT scans. This is rare. What happens before the procedure? Staying hydrated Follow instructions from your health care provider about hydration, which may include:  Up to 2 hours before the procedure - you may continue to drink clear liquids, such as water, clear fruit juice, black coffee, and plain tea. Eating and drinking restrictions Follow instructions from your health care provider about eating and drinking, which may include:  24 hours before the procedure - stop  drinking caffeinated beverages, such as energy drinks, tea, soda, coffee, and hot chocolate.  8 hours before the procedure - stop eating heavy meals or foods such as meat, fried foods, or fatty foods.  6 hours before the procedure - stop eating light meals or foods, such as toast or cereal.  6 hours before the procedure - stop drinking milk or drinks that contain milk.  2 hours before the procedure - stop drinking clear liquids. General instructions  Remove any jewelry.  Ask your health care provider about changing or stopping your regular medicines. This is especially important if you are taking diabetes medicines or blood thinners. What happens during the procedure?  You will lie on a table with your arms above your head.  An IV tube may be inserted into one of your veins.  The contrast dye may be injected into the IV tube. You may feel warm or have a metallic taste in your mouth.  The table you will be lying on will move into the CT scanner.  You will be able to see, hear, and talk to the person running the machine while you are in it. Follow that person's instructions.  The CT scanner will move around you to take pictures. Do not move while it is scanning. Staying still helps the scanner to get a good image.  When the best possible pictures have been taken, the machine will be turned off. The table will be moved out of the machine.  The IV tube will be removed. The procedure may vary among health care providers and hospitals. What happens after the procedure?  It is up to you to get the results of your procedure.  Ask your health care provider, or the department that is doing the procedure, when your results will be ready. Summary  A CT scan is an imaging scan.  A CT scan uses X-rays and a computer to make detailed pictures of different areas of your body.  Follow instructions from your health care provider about eating and drinking before the procedure.  You will be  able to see, hear, and talk to the person running the machine while you are in it. Follow that person's instructions. This information is not intended to replace advice given to you by your health care provider. Make sure you discuss any questions you have with your health care provider. Document Released: 01/31/2004 Document Revised: 01/26/2016 Document Reviewed: 01/26/2016 Elsevier Patient Education  [place]. Finasteride (Propecia) tablets What is this medicine? FINASTERIDE (fi NAS teer ide) is used to treat male pattern baldness in men only. This medicine is not for use in women. This medicine may be used for other purposes; ask your health care provider or pharmacist if you have questions. COMMON BRAND NAME(S): Propecia What should I tell my health care provider before I take this medicine? They need to know if you have any of these conditions:  liver disease  an unusual or allergic reaction to finasteride, other medicines, foods, dyes, or preservatives  pregnant or trying to get pregnant  breast-feeding How should I use this medicine? Take this medicine by mouth with a glass of water. Follow the directions on the prescription label. You can take this medicine with or without food. Take your doses at regular intervals. Do not take your medicine more often than directed. Talk to your pediatrician regarding the use of this medicine in children. Special care may be needed. Overdosage: If you think you have taken too much of this medicine contact a poison control center or emergency room at once. NOTE: This medicine is only for you. Do not share this medicine with others. What if I miss a dose? If you miss a dose, take it as soon as you can. If it is almost time for your next dose, take only that dose. Do not take double or extra doses. What may interact with this medicine?  saw palmetto or other dietary supplements This list may not describe all possible interactions. Give  your health care provider a list of all the medicines, herbs, non-prescription drugs, or dietary supplements you use. Also tell them if you smoke, drink alcohol, or use illegal drugs. Some items may interact with your medicine. What should I watch for while using this medicine? It may take at least three months of daily use of this medicine before you notice an improvement in you hair loss. You must continue to take this medicine to maintain the results. If you stop taking this medicine, the effect will be reversed within 12 months. Do not donate blood while you are taking this medicine. This will prevent giving this medicine to a pregnant male through a blood transfusion. Ask your doctor or health care professional when it is safe to donate blood after you stop taking this medicine. Women who are pregnant or may get pregnant must not handle broken or crushed finasteride tablets. The active ingredient could harm the unborn baby. If a pregnant woman comes into contact with broken or crushed tablets she should check with her doctor or health care professional. Exposure to whole tablets is not expected to cause harm as long as they are not swallowed. This medicine can  interfere with PSA laboratory tests for prostate cancer. If you are scheduled to have a lab test for prostate cancer, tell your doctor or health care professional that you are taking this medicine. This medicine may increase your risk of getting some cancers, like breast cancer. Talk with your doctor. What side effects may I notice from receiving this medicine? Side effects that you should report to your doctor or health care professional as soon as possible:  allergic reactions like skin rash, itching or hives, swelling of the face, lips, or tongue  changes in breast like lumps, pain or fluids leaking from the nipple  pain in the testicles Side effects that usually do not require medical attention (report to your doctor or health care  professional if they continue or are bothersome):  sexual difficulties like decreased sexual desire or ability to get an erection  small amount of semen released during sex This list may not describe all possible side effects. Call your doctor for medical advice about side effects. You may report side effects to FDA at 1-800-FDA-1088. Where should I keep my medicine? Keep out of the reach of children. Store at room temperature between 15 and 30 degrees C (59 and 86 degrees F). Protect from moisture. Keep container tightly closed. Throw away any unused medicine after the expiration date. NOTE: This sheet is a summary. It may not cover all possible information. If you have questions about this medicine, talk to your doctor, pharmacist, or health care provider.  2020 Elsevier/Gold Standard (2016-02-08 09:36:49) Finasteride (Proscar) tablets Qu es este medicamento? El FINASTERIDE se Merchant navy officer en los hombres con hiperplasia prosttica benigna (HPB). Es una condicin que le causa a tener una prstata agrandada. Este medicamento ayuda a Chief Technology Officer sus sntomas, disminuir la retencin urinaria y reducir el riesgo de necesitar una ciruga. Cuando se [place] en combinacin con ciertos otros medicamentos, este frmaco puede retrasar la evolucin de su enfermedad. Este medicamento puede ser utilizado para otros usos; si tiene alguna pregunta consulte con su proveedor de atencin mdica o con su farmacutico. MARCAS COMUNES: Proscar Qu le debo informar a mi profesional de la salud antes de tomar este medicamento? Necesita saber si usted presenta alguno de los siguientes problemas o situaciones:  enfermedad heptica  una reaccin alrgica o inusual al finasteride, a otros medicamentos, alimentos, colorantes o conservantes  si est embarazada o buscando quedar embarazada  si est amamantando a un beb Cmo debo utilizar este medicamento? Tome este medicamento por va oral con un vaso  de agua. Siga las instrucciones de la etiqueta del [place]. Este medicamento se puede tomar con o sin alimentos. Tome sus dosis a intervalos regulares. No tome su medicamento con una frecuencia mayor que la indicada. No deje de tomarlo excepto si as lo indica su mdico o su profesional de [employer]	[place]. Hable con su pediatra para informarse acerca del uso de este medicamento en nios. Puede requerir atencin especial. Sobredosis: Pngase en contacto inmediatamente con un centro toxicolgico o una sala de urgencia si usted cree que haya tomado demasiado medicamento. ATENCIN: [employer] es solo para usted. No comparta este medicamento con nadie. Qu sucede si me olvido de una dosis? Si olvida una dosis, tmela lo antes posible. Si es casi la hora de la prxima dosis, tome slo esa dosis. No tome dosis adicionales o dobles. Qu puede interactuar con este medicamento? palma enana americana u otros suplementos dietticos Puede ser que esta lista no menciona todas las posibles interacciones. Informe a  su profesional de la salud de todos los productos a base de hierbas, medicamentos de venta libre o suplementos nutritivos que est tomando. Si usted fuma, consume bebidas alcohlicas o si utiliza drogas ilegales, indqueselo tambin a su profesional de [employer]	[place]. Algunas sustancias pueden interactuar con su medicamento. A qu debo estar atento al usar Coca-Cola? No done Safeco Corporation est tomando Coca-Cola. As se evitar que se administre este medicamento a una mujer embarazada a travs de una transfusin de [place]. Pregunte a su mdico o su profesional de la salud cuando puede donar sangre despus de dejar de tomar Coca-Cola. Las mujeres embarazadas o que pueden quedar embarazadas no deben tocar Coca-Cola. El ingrediente [place] puede daar a un beb sin nacer. Si una mujer embarazada toca una tableta rota o triturada deber consultar a su mdico o a su profesional de Marketing executive. Se supone que el contacto con tabletas enteras no causa dao siempre y cuando no se traguen. Si sus sntomas no comienzan a mejorar, informe a su mdico a su profesional de [employer]	[place]. Quizs sea necesario que tome este medicamento durante 6 a 12 meses para obtener as los Levi Strauss. Este medicamento puede interferir con los anlisis de laboratorio de PSA para el cncer de prstata. Si va a realizarse un anlisis de laboratorio para determinar si tiene cncer de prstata, informe a su mdico o a su profesional de la salud que est American Express. Este medicamento puede aumentar el riegos de Insurance risk surveyor algunos cnceres, come cncer de mama. Consulte con su mdico. Qu efectos secundarios puedo tener al Mas[employer] Corporation este medicamento? Efectos secundarios que debe informar a su mdico o a Barrister's clerk de la salud tan pronto como sea posible:  Chief of Staff como erupcin cutnea, picazn o urticarias, hinchazn de la cara, labios o lengua  cambios en el pecho, como bultos, dolor o secreciones de lquidos de las tetillas  dolor de testculos Efectos secundarios que, por lo general, no requieren Geophysical data processor (debe informarlos a su mdico o a su profesional de la salud si persisten o si son molestos):  dificultades sexuales, como disminucin del deseo sexual o de la capacidad de Best boy una ereccin  liberacin de poca cantidad de semen al Boeing sexuales Puede ser que esta lista no menciona todos los posibles efectos secundarios. Comunquese a su mdico por asesoramiento mdico Humana Inc. Usted puede informar los efectos secundarios a la FDA por telfono al 1-800-FDA-1088. Dnde debo guardar mi medicina? Mantngala fuera del alcance de los nios. Gurdela a temperatura UnumProvident de 30 grados C (86 grados F). Protjala de la luz. Mantenga el envase bien cerrado. Deseche todo el medicamento que no haya utilizado, despus de la fecha de  vencimiento. ATENCIN: Este folleto es un resumen. Puede ser que no cubra toda la posible informacin. Si usted tiene preguntas acerca de esta medicina, consulte con su mdico, su farmacutico o su profesional de Technical sales engineer.  2020 Elsevier/Gold Standard (2014-09-14 00:00:00)

## 2018-08-23 ENCOUNTER — Encounter: Payer: Self-pay | Admitting: Urology

## 2018-09-07 ENCOUNTER — Telehealth: Payer: Self-pay | Admitting: Urology

## 2018-09-07 NOTE — Telephone Encounter (Signed)
Pt called office stating he is passing blood clots.  Sometimes a lot, sometimes just a little.  No other symptoms and not in any pain.  Please give pt a call.

## 2018-09-07 NOTE — Telephone Encounter (Signed)
He had prostate enlargement and prominent blood vessels which the cystoscope irritated.  This should resolve.  Would have him hold his aspirin for 3 to 5 days.

## 2018-09-07 NOTE — Telephone Encounter (Signed)
You did Cysto on 8/11. Please advise on the blood clots

## 2018-09-08 NOTE — Telephone Encounter (Signed)
Patient notified and voiced understanding.

## 2018-09-10 DIAGNOSIS — E119 Type 2 diabetes mellitus without complications: Secondary | ICD-10-CM | POA: Diagnosis not present

## 2018-09-10 DIAGNOSIS — R8290 Unspecified abnormal findings in urine: Secondary | ICD-10-CM | POA: Diagnosis not present

## 2018-09-10 DIAGNOSIS — R739 Hyperglycemia, unspecified: Secondary | ICD-10-CM | POA: Diagnosis not present

## 2018-09-10 DIAGNOSIS — N401 Enlarged prostate with lower urinary tract symptoms: Secondary | ICD-10-CM | POA: Diagnosis not present

## 2018-09-10 DIAGNOSIS — Z0000 Encounter for general adult medical examination without abnormal findings: Secondary | ICD-10-CM | POA: Diagnosis not present

## 2018-09-10 DIAGNOSIS — R5383 Other fatigue: Secondary | ICD-10-CM | POA: Diagnosis not present

## 2018-09-10 DIAGNOSIS — I10 Essential (primary) hypertension: Secondary | ICD-10-CM | POA: Diagnosis not present

## 2018-09-10 DIAGNOSIS — Z125 Encounter for screening for malignant neoplasm of prostate: Secondary | ICD-10-CM | POA: Diagnosis not present

## 2018-09-17 DIAGNOSIS — K219 Gastro-esophageal reflux disease without esophagitis: Secondary | ICD-10-CM | POA: Diagnosis not present

## 2018-09-17 DIAGNOSIS — I10 Essential (primary) hypertension: Secondary | ICD-10-CM | POA: Diagnosis not present

## 2018-09-17 DIAGNOSIS — R739 Hyperglycemia, unspecified: Secondary | ICD-10-CM | POA: Diagnosis not present

## 2018-09-17 DIAGNOSIS — R319 Hematuria, unspecified: Secondary | ICD-10-CM | POA: Diagnosis not present

## 2018-09-17 DIAGNOSIS — N401 Enlarged prostate with lower urinary tract symptoms: Secondary | ICD-10-CM | POA: Diagnosis not present

## 2018-10-22 ENCOUNTER — Other Ambulatory Visit: Payer: Self-pay

## 2018-10-22 ENCOUNTER — Encounter: Payer: Self-pay | Admitting: Urology

## 2018-10-22 ENCOUNTER — Ambulatory Visit: Payer: PPO | Admitting: Urology

## 2018-10-22 VITALS — BP 158/66 | HR 50 | Ht 66.5 in | Wt 170.0 lb

## 2018-10-22 DIAGNOSIS — R350 Frequency of micturition: Secondary | ICD-10-CM

## 2018-10-22 DIAGNOSIS — N401 Enlarged prostate with lower urinary tract symptoms: Secondary | ICD-10-CM | POA: Diagnosis not present

## 2018-10-22 DIAGNOSIS — R310 Gross hematuria: Secondary | ICD-10-CM | POA: Diagnosis not present

## 2018-10-22 LAB — URINALYSIS, COMPLETE
Bilirubin, UA: NEGATIVE
Glucose, UA: NEGATIVE
Ketones, UA: NEGATIVE
Leukocytes,UA: NEGATIVE
Nitrite, UA: NEGATIVE
Protein,UA: NEGATIVE
Specific Gravity, UA: 1.025 (ref 1.005–1.030)
Urobilinogen, Ur: 0.2 mg/dL (ref 0.2–1.0)
pH, UA: 5.5 (ref 5.0–7.5)

## 2018-10-22 LAB — BLADDER SCAN AMB NON-IMAGING

## 2018-10-22 LAB — MICROSCOPIC EXAMINATION: Bacteria, UA: NONE SEEN

## 2018-10-22 MED ORDER — OXYBUTYNIN CHLORIDE 5 MG PO TABS: ORAL_TABLET | ORAL | 3 refills | Status: DC

## 2018-10-22 NOTE — Progress Notes (Signed)
10/22/2018 10:35 AM   Terry Long 1950-03-10 [id number]  Referring provider: Tracie Harrier, MD [address] [facility] [place],  [place] [address]  Chief Complaint  Patient presents with  . Benign Prostatic Hypertrophy    Urologic history: 1.  BPH with lower urinary tract symptoms             -PVP 2005             -TURP 2011 at [facility]             -TURP 07/2012 by Dr. Jacqlyn Larsen in [place] prostatic urethral calculus, internal urethrotomy and TURP 03/2013 by Dr. Jacqlyn Larsen   HPI: 68 y.o. male presents for follow-up of BPH and gross hematuria.  At his visit July 2020 he had an episode of gross hematuria 3 months prior.  CT urogram and cystoscopy was recommended.  His cystoscopy was performed on 08/17/2018 and showed coapting lateral lobes distal prostatic urethra and no evidence of stricture or bladder neck contracture.  His CTU had not been performed but was recommended.  He called back on 09/07/2018 complaining of gross hematuria with clots which subsequently resolved.  He states he was never contacted by radiology regarding the CTU.  He has no complaints today.  IPSS was 8/35 with primary complaint of nocturia x4.  He had previously been on immediate release oxybutynin at bedtime which did improve his nocturia and has requested a refill.   PMH: Past Medical History:  Diagnosis Date  . GERD (gastroesophageal reflux disease)   . Hypertension   . Peptic ulcer     Surgical History: Past Surgical History:  Procedure Laterality Date  . COLONOSCOPY    . COLONOSCOPY N/A 10/24/2016   Procedure: COLONOSCOPY;  Surgeon: Manya Silvas, MD;  Location: [facility] ENDOSCOPY;  Service: Endoscopy;  Laterality: N/A;  . ESOPHAGOGASTRODUODENOSCOPY    . ESOPHAGOGASTRODUODENOSCOPY (EGD) WITH PROPOFOL N/A 10/24/2016   Procedure: ESOPHAGOGASTRODUODENOSCOPY (EGD) WITH PROPOFOL;  Surgeon: Manya Silvas, MD;  Location: [facility] ENDOSCOPY;  Service:  Endoscopy;  Laterality: N/A;  . PROSTATE SURGERY      Home Medications:  Allergies as of 10/22/2018   No Known Allergies     Medication List       Accurate as of October 22, 2018 10:35 AM. If you have any questions, ask your nurse or doctor.        amLODipine 5 MG tablet Commonly known as: NORVASC Take 5 mg by mouth daily.   aspirin EC 81 MG tablet Take 1 tablet by mouth daily.   atorvastatin 40 MG tablet Commonly known as: LIPITOR Take 1 tablet by mouth daily.   finasteride 5 MG tablet Commonly known as: PROSCAR Take 1 tablet (5 mg total) by mouth daily.   fluticasone 50 MCG/ACT nasal spray Commonly known as: FLONASE Place 1 spray into both nostrils 2 (two) times daily.   latanoprost 0.005 % ophthalmic solution Commonly known as: XALATAN   losartan 50 MG tablet Commonly known as: COZAAR Take 1 tablet by mouth daily.   metFORMIN 500 MG tablet Commonly known as: Glucophage Take 1 tablet (500 mg total) by mouth 2 (two) times daily with a meal.   omeprazole 40 MG capsule Commonly known as: PRILOSEC Take 40 mg by mouth 2 (two) times daily.   oxybutynin 5 MG tablet Commonly known as: DITROPAN 1 tablet at bedtime   polyethylene glycol powder 17 GM/SCOOP powder Commonly  known as: GLYCOLAX/MIRALAX Take 1 Container by mouth once.   sucralfate 1 g tablet Commonly known as: CARAFATE Take 1 g by mouth 4 (four) times daily -  with meals and at bedtime.       Allergies: No Known Allergies  Family History: No family history on file.  Social History:  reports that he has never smoked. He has never used smokeless tobacco. He reports that he does not drink alcohol or use drugs.  ROS: UROLOGY Frequent Urination?: Yes Hard to postpone urination?: No Burning/pain with urination?: No Get up at night to urinate?: Yes Leakage of urine?: No Urine stream starts and stops?: Yes Trouble starting stream?: No Do you have to strain to urinate?: No Blood in urine?:  No Urinary tract infection?: No Sexually transmitted disease?: No Injury to kidneys or bladder?: No Painful intercourse?: No Weak stream?: Yes Erection problems?: No Penile pain?: No  Gastrointestinal Nausea?: No Vomiting?: No Indigestion/heartburn?: No Diarrhea?: No Constipation?: Yes  Constitutional Fever: No Night sweats?: No Weight loss?: No Fatigue?: No  Skin Skin rash/lesions?: No Itching?: No  Eyes Blurred vision?: No Double vision?: No  Ears/Nose/Throat Sore throat?: No Sinus problems?: Yes  Hematologic/Lymphatic Swollen glands?: No Easy bruising?: No  Cardiovascular Leg swelling?: No Chest pain?: No  Respiratory Cough?: No Shortness of breath?: No  Endocrine Excessive thirst?: No  Musculoskeletal Back pain?: No Joint pain?: No  Neurological Headaches?: No Dizziness?: No  Psychologic Depression?: No Anxiety?: No  Physical Exam: BP (!) 158/66 (BP Location: Left Arm, Patient Position: Sitting, Cuff Size: Normal)   Pulse (!) 50   Ht 5' 6.5" (1.689 m)   Wt 170 lb (77.1 kg)   BMI 27.03 kg/m   Constitutional:  Alert and oriented, No acute distress. HEENT: [place] AT, moist mucus membranes.  Trachea midline, no masses. Cardiovascular: No clubbing, cyanosis, or edema. Respiratory: Normal respiratory effort, no increased work of breathing. GI: Abdomen is soft, nontender, nondistended, no abdominal masses GU: No CVA tenderness Lymph: No cervical or inguinal lymphadenopathy. Skin: No rashes, bruises or suspicious lesions. Neurologic: Grossly intact, no focal deficits, moving all 4 extremities. Psychiatric: Normal mood and affect.   Assessment & Plan:    -Gross hematuria Will have abmin staff check with radiology regarding scheduling of his CTU.  - Benign prostatic hyperplasia with urinary frequency Stable.  PVR by bladder scan was 70 mL.  - Nocturia Immediate release oxybutynin was refilled.   Abbie Sons, [place] [address], [place], [place] [address] [phone]

## 2018-10-24 ENCOUNTER — Encounter: Payer: Self-pay | Admitting: Urology

## 2018-11-18 DIAGNOSIS — H40153 Residual stage of open-angle glaucoma, bilateral: Secondary | ICD-10-CM | POA: Diagnosis not present

## 2019-01-10 ENCOUNTER — Other Ambulatory Visit: Payer: Self-pay | Admitting: Urology

## 2019-01-27 ENCOUNTER — Ambulatory Visit: Payer: PPO | Attending: Internal Medicine

## 2019-01-27 DIAGNOSIS — Z23 Encounter for immunization: Secondary | ICD-10-CM | POA: Insufficient documentation

## 2019-01-31 NOTE — Progress Notes (Signed)
   Covid-19 Vaccination Clinic  Name:  Terry Long    MRN: [id number] DOB: 09-Dec-1950  01/27/2019  Mr. Terry Long was observed post Covid-19 immunization for 15 minutes without incidence. He was provided with Vaccine Information Sheet and instruction to access the V-Safe system.   Mr. Terry Long was instructed to call 911 with any severe reactions post vaccine: Marland Kitchen Difficulty breathing  . Swelling of your face and throat  . A fast heartbeat  . A bad rash all over your body  . Dizziness and weakness    Immunizations Administered    Name Date Dose VIS Date Route   Moderna COVID-19 Vaccine 01/27/2019  7:05 PM 0.5 mL 12/07/2018 Intramuscular   Manufacturer: Moderna   Lot: [id number]   [id number]

## 2019-02-04 ENCOUNTER — Other Ambulatory Visit: Payer: Self-pay | Admitting: Student

## 2019-02-04 DIAGNOSIS — M25511 Pain in right shoulder: Secondary | ICD-10-CM | POA: Diagnosis not present

## 2019-02-04 DIAGNOSIS — M7581 Other shoulder lesions, right shoulder: Secondary | ICD-10-CM

## 2019-02-04 DIAGNOSIS — S46211A Strain of muscle, fascia and tendon of other parts of biceps, right arm, initial encounter: Secondary | ICD-10-CM

## 2019-02-04 DIAGNOSIS — G8929 Other chronic pain: Secondary | ICD-10-CM | POA: Diagnosis not present

## 2019-02-11 ENCOUNTER — Other Ambulatory Visit: Payer: PPO

## 2019-02-13 ENCOUNTER — Ambulatory Visit
Admission: RE | Admit: 2019-02-13 | Discharge: 2019-02-13 | Disposition: A | Discharge status: Home / Self Care | Payer: PPO | Source: Ambulatory Visit | Attending: Student | Admitting: Student

## 2019-02-13 ENCOUNTER — Other Ambulatory Visit: Payer: Self-pay

## 2019-02-13 DIAGNOSIS — M7581 Other shoulder lesions, right shoulder: Secondary | ICD-10-CM

## 2019-02-13 DIAGNOSIS — S46211A Strain of muscle, fascia and tendon of other parts of biceps, right arm, initial encounter: Secondary | ICD-10-CM | POA: Insufficient documentation

## 2019-02-13 DIAGNOSIS — M25511 Pain in right shoulder: Secondary | ICD-10-CM | POA: Diagnosis not present

## 2019-02-21 DIAGNOSIS — M75121 Complete rotator cuff tear or rupture of right shoulder, not specified as traumatic: Secondary | ICD-10-CM | POA: Diagnosis not present

## 2019-02-21 DIAGNOSIS — M7581 Other shoulder lesions, right shoulder: Secondary | ICD-10-CM | POA: Diagnosis not present

## 2019-02-21 DIAGNOSIS — S46101A Unspecified injury of muscle, fascia and tendon of long head of biceps, right arm, initial encounter: Secondary | ICD-10-CM | POA: Diagnosis not present

## 2019-02-24 ENCOUNTER — Ambulatory Visit: Payer: PPO

## 2019-03-01 ENCOUNTER — Ambulatory Visit: Payer: PPO | Attending: Internal Medicine

## 2019-03-01 DIAGNOSIS — Z23 Encounter for immunization: Secondary | ICD-10-CM

## 2019-03-01 NOTE — Progress Notes (Signed)
   Covid-19 Vaccination Clinic  Name:  Terry Long    MRN: [id number] DOB: 24-May-1950  03/01/2019  Mr. Terry Long was observed post Covid-19 immunization for 15 minutes without incidence. He was provided with Vaccine Information Sheet and instruction to access the V-Safe system.   Mr. Terry Long was instructed to call 911 with any severe reactions post vaccine: Marland Kitchen Difficulty breathing  . Swelling of your face and throat  . A fast heartbeat  . A bad rash all over your body  . Dizziness and weakness    Immunizations Administered    Name Date Dose VIS Date Route   Moderna COVID-19 Vaccine 03/01/2019  4:00 PM 0.5 mL 12/07/2018 Intramuscular   Manufacturer: Moderna   Lot: [id number]   [id number]

## 2019-03-03 ENCOUNTER — Other Ambulatory Visit: Payer: Self-pay | Admitting: Surgery

## 2019-03-15 ENCOUNTER — Encounter
Admission: RE | Admit: 2019-03-15 | Discharge: 2019-03-15 | Disposition: A | Discharge status: Home / Self Care | Payer: PPO | Source: Ambulatory Visit | Attending: Surgery | Admitting: Surgery

## 2019-03-15 ENCOUNTER — Other Ambulatory Visit: Payer: Self-pay

## 2019-03-15 DIAGNOSIS — Z01818 Encounter for other preprocedural examination: Secondary | ICD-10-CM | POA: Insufficient documentation

## 2019-03-15 NOTE — Patient Instructions (Signed)
Your procedure is scheduled on: Thursday March 24, 2019 Su procedimiento est programado para: Terry Long 18 de Marzo, 2021 Report to Day Surgery. Presntese a: Terry Long  To find out your arrival time please call [phone] between 1PM - 3PM on Wednesday March 23, 2019. Para saber su hora de llegada por favor llame al [phone] entre la 1PM - 3PM el da: [place],    Remember: Instructions that are not followed completely may result in serious medical risk, up to and including death,  or upon the discretion of your surgeon and anesthesiologist your surgery may need to be rescheduled.  Recuerde: Las instrucciones que no se siguen completamente Heritage manager en un riesgo de salud grave, incluyendo hasta  la [place] o a discrecin de su cirujano y Environmental health practitioner, su ciruga se puede posponer.   __X_ 1.Do not eat food after midnight the night before your procedure. No    gum chewing or hard candies. You may drink clear liquids up to 2 hours     before you are scheduled to arrive for your surgery- DO not drink clear     Liquids within 2 hours of the start of your surgery.     Clear Liquids include:    water, apple juice without pulp, clear carbohydrate drink such as    Clearfast of [place]ade, Black Coffee or Tea (Do not add anything to coffee or tea).      No coma nada despus de la medianoche de la noche anterior a su    procedimiento. No coma chicles ni caramelos duros. Puede tomar    lquidos claros hasta 2 horas antes de su hora programada de llegada al     hospital para su procedimiento. No tome lquidos claros durante el     transcurso de las 2 horas de su llegada programada al hospital para su     procedimiento, ya que esto puede llevar a que su procedimiento se    retrase o tenga que volver a Health and safety inspector.  Los lquidos claros incluyen:          - Agua o jugo de [place] sin pulpa          - Bebidas claras con carbohidratos como ClearFast o Gatorade           - Caf negro o t claro (sin leche, sin cremas, no agregue nada al caf ni al t)  No tome nada que no est en esta lista.  Los pacientes con diabetes tipo 1 y tipo 2 solo deben Agricultural engineer.  Llame a la clnica de [facility] o a la unidad de Same Day Surgery si  tiene alguna pregunta sobre estas instrucciones.              _X__ 2.Do Not Smoke or use e-cigarettes For 24 Hours Prior to Your Surgery.    Do not use any chewable tobacco products for at least 6   hours prior to surgery.    No fume ni use cigarrillos electrnicos durante las 24 horas previas    a su [place].  No use ningn producto de tabaco masticable durante   al menos 6 horas antes de la [place].     __X_ 3. No alcohol for 24 hours before or after surgery.    No tome alcohol durante las 24 horas antes ni despus de la [place].    __x__ 5. Notify your doctor if there is any change in your medical condition (cold,fever, infections).    Informe  a su mdico si hay algn cambio en su condicin mdica  (resfriado, fiebre, infecciones).   Do not wear jewelry, make-up, hairpins, clips or nail polish.  No use joyas, maquillajes, pinzas/ganchos para el cabello ni esmalte de uas.  Do not wear lotions, powders, or perfumes. You may wear deodorant.  No use lociones, polvos o perfumes.  Puede usar desodorante.    Do not shave 48 hours prior to surgery. Men may shave face and neck.  No se afeite 48 horas antes de la [place].  Los hombres pueden [employer] cara  y el cuello.   Do not bring valuables to the hospital.   No lleve objetos [place] is not responsible for any belongings or valuables.  [place] no se hace responsable de ningn tipo de pertenencias u objetos de Geographical information systems officer.               Contacts, dentures or bridgework may not be worn into surgery.  Los lentes de [place], las dentaduras postizas o puentes no se pueden usar en la [place].   Leave your suitcase in the car. After surgery it may be  brought to your room.  Deje su maleta en el auto.  Despus de la ciruga podr traerla a su habitacin.   For patients admitted to the hospital, discharge time is determined by your  treatment team.  Para los pacientes que sean ingresados al hospital, el tiempo en el cual se le  dar de alta es determinado por su equipo de [place].   Patients discharged the day of surgery will not be allowed to drive home. A los pacientes que se les da de alta el mismo da de la ciruga no se les permitir conducir a Holiday representative.     __x__ Take these medicines the morning of surgery with A SIP OF WATER:          Tome estas medicinas la maana de la ciruga con UN SORBO DE AGUA:  1. amLODipine (NORVASC)   2. finasteride (PROSCAR)  3. omeprazole (PRILOSEC)   __x__ Use CHG Soap as directed          Utilice el jabn de CHG segn lo indicado  __x__ Stop aspirin as instructed by your provider.            Deje de tomar aspirina como le aviso su doctor.  __x__ Stop Anti-inflammatories such as ibuprofen, Aleve, naproxen and or BC powders.          Deje de tomar antiinflamatorios como ibuprofen, Aleve, naproxen o polvos de BC powders.   __x__ Stop supplements until after surgery            Deje de tomar suplementos hasta despus de la ciruga  __x__ Do not start any herbal supplements before your surgery.  No comienze suplementos de hierbas antes de la [place].

## 2019-03-21 DIAGNOSIS — M75121 Complete rotator cuff tear or rupture of right shoulder, not specified as traumatic: Secondary | ICD-10-CM | POA: Diagnosis not present

## 2019-03-22 ENCOUNTER — Encounter
Admission: RE | Admit: 2019-03-22 | Discharge: 2019-03-22 | Disposition: A | Discharge status: Home / Self Care | Payer: PPO | Source: Ambulatory Visit | Attending: Surgery | Admitting: Surgery

## 2019-03-22 ENCOUNTER — Other Ambulatory Visit: Payer: Self-pay

## 2019-03-22 DIAGNOSIS — Z20822 Contact with and (suspected) exposure to covid-19: Secondary | ICD-10-CM | POA: Diagnosis not present

## 2019-03-22 DIAGNOSIS — Z01818 Encounter for other preprocedural examination: Secondary | ICD-10-CM | POA: Insufficient documentation

## 2019-03-22 LAB — SARS CORONAVIRUS 2 (TAT 6-24 HRS): SARS Coronavirus 2: NEGATIVE

## 2019-03-24 ENCOUNTER — Ambulatory Visit: Payer: PPO | Admitting: Certified Registered Nurse Anesthetist

## 2019-03-24 ENCOUNTER — Other Ambulatory Visit: Payer: Self-pay

## 2019-03-24 ENCOUNTER — Ambulatory Visit
Admission: RE | Admit: 2019-03-24 | Discharge: 2019-03-24 | Disposition: A | Discharge status: Home / Self Care | Payer: PPO | Attending: Surgery | Admitting: Surgery

## 2019-03-24 ENCOUNTER — Ambulatory Visit: Payer: PPO

## 2019-03-24 ENCOUNTER — Encounter: Payer: Self-pay | Admitting: Surgery

## 2019-03-24 ENCOUNTER — Encounter
Admission: RE | Disposition: A | Discharge status: Home / Self Care | Payer: Self-pay | Source: Home / Self Care | Attending: Surgery

## 2019-03-24 DIAGNOSIS — K219 Gastro-esophageal reflux disease without esophagitis: Secondary | ICD-10-CM | POA: Diagnosis not present

## 2019-03-24 DIAGNOSIS — M75111 Incomplete rotator cuff tear or rupture of right shoulder, not specified as traumatic: Secondary | ICD-10-CM | POA: Diagnosis not present

## 2019-03-24 DIAGNOSIS — S46101A Unspecified injury of muscle, fascia and tendon of long head of biceps, right arm, initial encounter: Secondary | ICD-10-CM | POA: Diagnosis not present

## 2019-03-24 DIAGNOSIS — G8918 Other acute postprocedural pain: Secondary | ICD-10-CM | POA: Diagnosis not present

## 2019-03-24 DIAGNOSIS — Z7982 Long term (current) use of aspirin: Secondary | ICD-10-CM | POA: Diagnosis not present

## 2019-03-24 DIAGNOSIS — S46111A Strain of muscle, fascia and tendon of long head of biceps, right arm, initial encounter: Secondary | ICD-10-CM | POA: Insufficient documentation

## 2019-03-24 DIAGNOSIS — M7541 Impingement syndrome of right shoulder: Secondary | ICD-10-CM | POA: Insufficient documentation

## 2019-03-24 DIAGNOSIS — M65811 Other synovitis and tenosynovitis, right shoulder: Secondary | ICD-10-CM | POA: Insufficient documentation

## 2019-03-24 DIAGNOSIS — Z79899 Other long term (current) drug therapy: Secondary | ICD-10-CM | POA: Insufficient documentation

## 2019-03-24 DIAGNOSIS — M75121 Complete rotator cuff tear or rupture of right shoulder, not specified as traumatic: Secondary | ICD-10-CM | POA: Insufficient documentation

## 2019-03-24 DIAGNOSIS — Z419 Encounter for procedure for purposes other than remedying health state, unspecified: Secondary | ICD-10-CM

## 2019-03-24 DIAGNOSIS — I10 Essential (primary) hypertension: Secondary | ICD-10-CM | POA: Insufficient documentation

## 2019-03-24 DIAGNOSIS — M25511 Pain in right shoulder: Secondary | ICD-10-CM | POA: Diagnosis not present

## 2019-03-24 DIAGNOSIS — X58XXXA Exposure to other specified factors, initial encounter: Secondary | ICD-10-CM | POA: Diagnosis not present

## 2019-03-24 DIAGNOSIS — S46011A Strain of muscle(s) and tendon(s) of the rotator cuff of right shoulder, initial encounter: Secondary | ICD-10-CM | POA: Diagnosis not present

## 2019-03-24 DIAGNOSIS — M7581 Other shoulder lesions, right shoulder: Secondary | ICD-10-CM | POA: Diagnosis not present

## 2019-03-24 HISTORY — PX: SHOULDER ARTHROSCOPY WITH SUBACROMIAL DECOMPRESSION AND OPEN ROTATOR C: SHX5688

## 2019-03-24 LAB — POCT I-STAT, CHEM 8
BUN: 20 mg/dL (ref 8–23)
Calcium, Ion: 1.17 mmol/L (ref 1.15–1.40)
Chloride: 105 mmol/L (ref 98–111)
Creatinine, Ser: 0.9 mg/dL (ref 0.61–1.24)
Glucose, Bld: 123 mg/dL — ABNORMAL HIGH (ref 70–99)
HCT: 47 % (ref 39.0–52.0)
Hemoglobin: 16 g/dL (ref 13.0–17.0)
Potassium: 3.7 mmol/L (ref 3.5–5.1)
Sodium: 141 mmol/L (ref 135–145)
TCO2: 22 mmol/L (ref 22–32)

## 2019-03-24 SURGERY — SHOULDER ARTHROSCOPY WITH SUBACROMIAL DECOMPRESSION AND OPEN ROTATOR CUFF REPAIR, OPEN BICEPS TENDON REPAIR
Anesthesia: General | Site: Shoulder | Laterality: Right

## 2019-03-24 MED ORDER — LACTATED RINGERS IV SOLN
INTRAVENOUS | Status: DC | PRN
  Administered 2019-03-24: 3000 mL

## 2019-03-24 MED ORDER — ACETAMINOPHEN 160 MG/5ML PO SOLN
325.0000 mg | ORAL | Status: DC | PRN
  Filled 2019-03-24: qty 20.3

## 2019-03-24 MED ORDER — SODIUM CHLORIDE 0.9 % IV SOLN
INTRAVENOUS | Status: DC | PRN
  Administered 2019-03-24: 20 ug/min via INTRAVENOUS

## 2019-03-24 MED ORDER — ROCURONIUM BROMIDE 10 MG/ML (PF) SYRINGE
PREFILLED_SYRINGE | INTRAVENOUS | Status: AC
  Filled 2019-03-24: qty 10

## 2019-03-24 MED ORDER — DEXAMETHASONE SODIUM PHOSPHATE 10 MG/ML IJ SOLN
INTRAMUSCULAR | Status: DC | PRN
  Administered 2019-03-24: 10 mg via INTRAVENOUS

## 2019-03-24 MED ORDER — ACETAMINOPHEN 10 MG/ML IV SOLN
INTRAVENOUS | Status: AC
  Filled 2019-03-24: qty 100

## 2019-03-24 MED ORDER — LACTATED RINGERS IV SOLN: INTRAVENOUS | Status: DC

## 2019-03-24 MED ORDER — EPHEDRINE SULFATE 50 MG/ML IJ SOLN
INTRAMUSCULAR | Status: DC | PRN
  Administered 2019-03-24: 10 mg via INTRAVENOUS

## 2019-03-24 MED ORDER — ACETAMINOPHEN 325 MG PO TABS: 325.0000 mg | ORAL_TABLET | ORAL | Status: DC | PRN

## 2019-03-24 MED ORDER — BUPIVACAINE LIPOSOME 1.3 % IJ SUSP
INTRAMUSCULAR | Status: AC
  Filled 2019-03-24: qty 20

## 2019-03-24 MED ORDER — CHLORHEXIDINE GLUCONATE 4 % EX LIQD
60.0000 mL | Freq: Once | CUTANEOUS | Status: AC
  Administered 2019-03-24: 4 via TOPICAL

## 2019-03-24 MED ORDER — FENTANYL CITRATE (PF) 100 MCG/2ML IJ SOLN
INTRAMUSCULAR | Status: AC
  Filled 2019-03-24: qty 2

## 2019-03-24 MED ORDER — OXYCODONE HCL 5 MG PO TABS: 5.0000 mg | ORAL_TABLET | ORAL | 0 refills | Status: DC | PRN

## 2019-03-24 MED ORDER — MIDAZOLAM HCL 2 MG/2ML IJ SOLN
INTRAMUSCULAR | Status: AC
  Administered 2019-03-24: 09:00:00 1 mg via INTRAVENOUS
  Filled 2019-03-24: qty 2

## 2019-03-24 MED ORDER — BUPIVACAINE HCL (PF) 0.5 % IJ SOLN
INTRAMUSCULAR | Status: AC
  Filled 2019-03-24: qty 30

## 2019-03-24 MED ORDER — EPHEDRINE 5 MG/ML INJ
INTRAVENOUS | Status: AC
  Filled 2019-03-24: qty 10

## 2019-03-24 MED ORDER — CEFAZOLIN SODIUM-DEXTROSE 2-4 GM/100ML-% IV SOLN
INTRAVENOUS | Status: AC
  Filled 2019-03-24: qty 100

## 2019-03-24 MED ORDER — BUPIVACAINE HCL (PF) 0.5 % IJ SOLN
INTRAMUSCULAR | Status: DC | PRN
  Administered 2019-03-24: 10 mL via PERINEURAL

## 2019-03-24 MED ORDER — LIDOCAINE HCL (CARDIAC) PF 100 MG/5ML IV SOSY
PREFILLED_SYRINGE | INTRAVENOUS | Status: DC | PRN
  Administered 2019-03-24: 100 mg via INTRAVENOUS

## 2019-03-24 MED ORDER — FENTANYL CITRATE (PF) 100 MCG/2ML IJ SOLN
INTRAMUSCULAR | Status: DC | PRN
  Administered 2019-03-24 (×2): 50 ug via INTRAVENOUS

## 2019-03-24 MED ORDER — PROPOFOL 10 MG/ML IV BOLUS
INTRAVENOUS | Status: AC
  Filled 2019-03-24: qty 20

## 2019-03-24 MED ORDER — CEFAZOLIN SODIUM-DEXTROSE 2-4 GM/100ML-% IV SOLN
2.0000 g | INTRAVENOUS | Status: AC
  Administered 2019-03-24: 2 g via INTRAVENOUS

## 2019-03-24 MED ORDER — MIDAZOLAM HCL 2 MG/2ML IJ SOLN: 1.0000 mg | Freq: Once | INTRAMUSCULAR | Status: AC

## 2019-03-24 MED ORDER — GLYCOPYRROLATE 0.2 MG/ML IJ SOLN
INTRAMUSCULAR | Status: DC | PRN
  Administered 2019-03-24: .2 mg via INTRAVENOUS

## 2019-03-24 MED ORDER — LIDOCAINE HCL 4 % EX SOLN
CUTANEOUS | Status: DC | PRN
  Administered 2019-03-24: 4 mL via TOPICAL

## 2019-03-24 MED ORDER — PROPOFOL 10 MG/ML IV BOLUS
INTRAVENOUS | Status: DC | PRN
  Administered 2019-03-24: 170 mg via INTRAVENOUS

## 2019-03-24 MED ORDER — LIDOCAINE HCL (PF) 1 % IJ SOLN
INTRAMUSCULAR | Status: AC
  Filled 2019-03-24: qty 5

## 2019-03-24 MED ORDER — FENTANYL CITRATE (PF) 100 MCG/2ML IJ SOLN: 50.0000 ug | Freq: Once | INTRAMUSCULAR | Status: AC

## 2019-03-24 MED ORDER — PROMETHAZINE HCL 25 MG/ML IJ SOLN: 6.2500 mg | INTRAMUSCULAR | Status: DC | PRN

## 2019-03-24 MED ORDER — SUGAMMADEX SODIUM 200 MG/2ML IV SOLN
INTRAVENOUS | Status: DC | PRN
  Administered 2019-03-24: 200 mg via INTRAVENOUS

## 2019-03-24 MED ORDER — ONDANSETRON HCL 4 MG/2ML IJ SOLN
INTRAMUSCULAR | Status: AC
  Filled 2019-03-24: qty 2

## 2019-03-24 MED ORDER — ACETAMINOPHEN 10 MG/ML IV SOLN
INTRAVENOUS | Status: DC | PRN
  Administered 2019-03-24: 1000 mg via INTRAVENOUS

## 2019-03-24 MED ORDER — BUPIVACAINE LIPOSOME 1.3 % IJ SUSP
INTRAMUSCULAR | Status: DC | PRN
  Administered 2019-03-24: 15 mL via PERINEURAL

## 2019-03-24 MED ORDER — ROCURONIUM BROMIDE 100 MG/10ML IV SOLN
INTRAVENOUS | Status: DC | PRN
  Administered 2019-03-24: 50 mg via INTRAVENOUS
  Administered 2019-03-24: 20 mg via INTRAVENOUS

## 2019-03-24 MED ORDER — GLYCOPYRROLATE 0.2 MG/ML IJ SOLN
INTRAMUSCULAR | Status: AC
  Filled 2019-03-24: qty 1

## 2019-03-24 MED ORDER — DEXAMETHASONE SODIUM PHOSPHATE 10 MG/ML IJ SOLN
INTRAMUSCULAR | Status: AC
  Filled 2019-03-24: qty 1

## 2019-03-24 MED ORDER — BUPIVACAINE HCL (PF) 0.5 % IJ SOLN
INTRAMUSCULAR | Status: AC
  Filled 2019-03-24: qty 10

## 2019-03-24 MED ORDER — MIDAZOLAM HCL 2 MG/2ML IJ SOLN
INTRAMUSCULAR | Status: AC
  Filled 2019-03-24: qty 2

## 2019-03-24 MED ORDER — FENTANYL CITRATE (PF) 100 MCG/2ML IJ SOLN
INTRAMUSCULAR | Status: AC
  Administered 2019-03-24: 50 ug via INTRAVENOUS
  Filled 2019-03-24: qty 2

## 2019-03-24 MED ORDER — ONDANSETRON HCL 4 MG/2ML IJ SOLN
INTRAMUSCULAR | Status: DC | PRN
  Administered 2019-03-24: 4 mg via INTRAVENOUS

## 2019-03-24 MED ORDER — EPINEPHRINE PF 1 MG/ML IJ SOLN
INTRAMUSCULAR | Status: AC
  Filled 2019-03-24: qty 1

## 2019-03-24 SURGICAL SUPPLY — 55 items
ANCHOR BONE REGENETEN (Anchor) ×1 IMPLANT
ANCHOR HEALICOIL REGEN 5.5 (Anchor) ×2 IMPLANT
ANCHOR JUGGERKNOT WTAP NDL 2.9 (Anchor) ×3 IMPLANT
ANCHOR TENDON REGENETEN (Staple) ×1 IMPLANT
BIT DRILL JUGRKNT W/NDL BIT2.9 (DRILL) IMPLANT
BLADE FULL RADIUS 3.5 (BLADE) ×2 IMPLANT
BUR ACROMIONIZER 4.0 (BURR) ×2 IMPLANT
CANNULA SHAVER 8MMX76MM (CANNULA) ×2 IMPLANT
CHLORAPREP W/TINT 26 (MISCELLANEOUS) ×2 IMPLANT
COVER MAYO STAND REUSABLE (DRAPES) ×2 IMPLANT
COVER WAND RF STERILE (DRAPES) ×2 IMPLANT
DILATOR 5.5 THREADED HEALICOIL (MISCELLANEOUS) ×2 IMPLANT
DRAPE IMP U-DRAPE 54X76 (DRAPES) ×4 IMPLANT
DRILL JUGGERKNOT W/NDL BIT 2.9 (DRILL) ×2
ELECT CAUTERY BLADE TIP 2.5 (TIP) ×2
ELECT REM PT RETURN 9FT ADLT (ELECTROSURGICAL) ×2
ELECTRODE CAUTERY BLDE TIP 2.5 (TIP) ×1 IMPLANT
ELECTRODE REM PT RTRN 9FT ADLT (ELECTROSURGICAL) ×1 IMPLANT
GAUZE SPONGE 4X4 12PLY STRL (GAUZE/BANDAGES/DRESSINGS) ×2 IMPLANT
GAUZE XEROFORM 1X8 LF (GAUZE/BANDAGES/DRESSINGS) ×2 IMPLANT
GLOVE BIO SURGEON STRL SZ7.5 (GLOVE) ×5 IMPLANT
GLOVE BIO SURGEON STRL SZ8 (GLOVE) ×4 IMPLANT
GLOVE BIOGEL PI IND STRL 8 (GLOVE) ×1 IMPLANT
GLOVE BIOGEL PI INDICATOR 8 (GLOVE) ×2
GLOVE INDICATOR 8.0 STRL GRN (GLOVE) ×2 IMPLANT
GOWN STRL REUS W/ TWL LRG LVL3 (GOWN DISPOSABLE) ×1 IMPLANT
GOWN STRL REUS W/ TWL XL LVL3 (GOWN DISPOSABLE) ×1 IMPLANT
GOWN STRL REUS W/TWL LRG LVL3 (GOWN DISPOSABLE) ×3
GOWN STRL REUS W/TWL XL LVL3 (GOWN DISPOSABLE) ×1
GRASPER SUT 15 45D LOW PRO (SUTURE) IMPLANT
IMPL REGENETEN MEDIUM (Shoulder) IMPLANT
IMPLANT REGENETEN MEDIUM (Shoulder) ×2 IMPLANT
IV LACTATED RINGER IRRG 3000ML (IV SOLUTION) ×2
IV LR IRRIG 3000ML ARTHROMATIC (IV SOLUTION) ×2 IMPLANT
KIT CANNULA 8X76-LX IN CANNULA (CANNULA) IMPLANT
MANIFOLD NEPTUNE II (INSTRUMENTS) ×2 IMPLANT
MASK FACE SPIDER DISP (MASK) ×2 IMPLANT
MAT ABSORB  FLUID 56X50 GRAY (MISCELLANEOUS) ×1
MAT ABSORB FLUID 56X50 GRAY (MISCELLANEOUS) ×1 IMPLANT
PACK ARTHROSCOPY SHOULDER (MISCELLANEOUS) ×2 IMPLANT
PAD ABD DERMACEA PRESS 5X9 (GAUZE/BANDAGES/DRESSINGS) ×2 IMPLANT
PASSER SUT FIRSTPASS SELF (INSTRUMENTS) IMPLANT
SLING ARM LRG DEEP (SOFTGOODS) ×1 IMPLANT
SLING ULTRA II LG (MISCELLANEOUS) ×1 IMPLANT
STAPLER SKIN PROX 35W (STAPLE) ×2 IMPLANT
STRAP SAFETY 5IN WIDE (MISCELLANEOUS) ×2 IMPLANT
SUT ETHIBOND 0 MO6 C/R (SUTURE) ×2 IMPLANT
SUT PROLENE 0 CT 1 30 (SUTURE) ×1 IMPLANT
SUT ULTRABRAID 2 COBRAID 38 (SUTURE) IMPLANT
SUT VIC AB 2-0 CT1 27 (SUTURE) ×2
SUT VIC AB 2-0 CT1 TAPERPNT 27 (SUTURE) ×2 IMPLANT
TAPE MICROFOAM 4IN (TAPE) ×2 IMPLANT
TUBING ARTHRO INFLOW-ONLY STRL (TUBING) ×2 IMPLANT
TUBING CONNECTING 10 (TUBING) ×2 IMPLANT
WAND WEREWOLF FLOW 90D (MISCELLANEOUS) ×2 IMPLANT

## 2019-03-24 NOTE — H&P (Signed)
Paper H&P to be scanned into permanent record. H&P reviewed and patient re-examined. No changes. 

## 2019-03-24 NOTE — Transfer of Care (Signed)
Immediate Anesthesia Transfer of Care Note  Patient: Environmental education officer  Procedure(s) Performed: RIGHT SHOULDER ARTHROSCOPY WITH DEBRIDEMENT, DECOMPRESSION, AND ROTATOR CUFF REPAIR (Right Shoulder)  Patient Location: PACU  Anesthesia Type:General  Level of Consciousness: drowsy  Airway & Oxygen Therapy: Patient Spontanous Breathing and Patient connected to face mask oxygen  Post-op Assessment: Report given to RN and Post -op Vital signs reviewed and stable  Post vital signs: Reviewed and stable  Last Vitals:  Vitals Value Taken Time  BP 117/57 03/24/19 1241  Temp    Pulse 62 03/24/19 1244  Resp 16 03/24/19 1244  SpO2 96 % 03/24/19 1244  Vitals shown include unvalidated device data.  Last Pain:  Vitals:   03/24/19 0945  TempSrc:   PainSc: 0-No pain      Patients Stated Pain Goal: 0 ([id number])  Complications: No apparent anesthesia complications

## 2019-03-24 NOTE — Anesthesia Preprocedure Evaluation (Addendum)
Anesthesia Evaluation  Patient identified by MRN, date of birth, ID band Patient awake    Reviewed: Allergy & Precautions, H&P , NPO status , reviewed documented beta blocker date and time   Airway Mallampati: II  TM Distance: >3 FB Neck ROM: full    Dental  (+) Chipped, Missing, Partial Lower   Pulmonary    Pulmonary exam normal        Cardiovascular hypertension, Normal cardiovascular exam     Neuro/Psych    GI/Hepatic PUD, GERD  Medicated and Controlled,  Endo/Other    Renal/GU      Musculoskeletal   Abdominal   Peds  Hematology   Anesthesia Other Findings Past Medical History: No date: GERD (gastroesophageal reflux disease) No date: Hypertension No date: Peptic ulcer Past Surgical History: No date: COLONOSCOPY 10/24/2016: COLONOSCOPY; N/A     Comment:  Procedure: COLONOSCOPY;  Surgeon: Manya Silvas, MD;              Location[facility] ENDOSCOPY;  Service: Endoscopy;                Laterality: N/A; No date: ESOPHAGOGASTRODUODENOSCOPY 10/24/2016: ESOPHAGOGASTRODUODENOSCOPY (EGD) WITH PROPOFOL; N/A     Comment:  Procedure: ESOPHAGOGASTRODUODENOSCOPY (EGD) WITH               PROPOFOL;  Surgeon: Manya Silvas, MD;  Location:               [facility] ENDOSCOPY;  Service: Endoscopy;  Laterality: N/A; No date: PROSTATE SURGERY   Reproductive/Obstetrics                             Anesthesia Physical Anesthesia Plan  ASA: II  Anesthesia Plan: General   Post-op Pain Management:  Regional for Post-op pain   Induction: Intravenous  PONV Risk Score and Plan: 2 and Ondansetron, Treatment may vary due to age or medical condition, Midazolam and Dexamethasone  Airway Management Planned: Oral ETT  Additional Equipment:   Intra-op Plan:   Post-operative Plan: Extubation in OR  Informed Consent: I have reviewed the patients History and Physical, chart, labs and discussed the procedure  including the risks, benefits and alternatives for the proposed anesthesia with the patient or authorized representative who has indicated his/her understanding and acceptance.     Dental Advisory Given  Plan Discussed with: CRNA  Anesthesia Plan Comments:        Anesthesia Quick Evaluation

## 2019-03-24 NOTE — Anesthesia Procedure Notes (Signed)
Anesthesia Regional Block: Interscalene brachial plexus block   Pre-Anesthetic Checklist: ,, timeout performed, Correct Patient, Correct Site, Correct Laterality, Correct Procedure, Correct Position, site marked, Risks and benefits discussed,  Surgical consent,  Pre-op evaluation,  At surgeon's request and post-op pain management  Laterality: Upper and Right  Prep: alcohol swabs       Needles:  Injection technique: Single-shot  Needle Type: Echogenic Stimulator Needle     Needle Length: 10cm  Needle Gauge: 21     Additional Needles:   Procedures: Doppler guided,,,, ultrasound used (permanent image in chart),,,,  Motor weakness within 8 minutes.  Narrative:  Start time: 03/24/2019 9:18 AM End time: 03/24/2019 9:26 AM Injection made incrementally with aspirations every 5 mL.  Performed by: Personally  Anesthesiologist: Alphonsus Sias, MD  Additional Notes: Functioning IV was confirmed and O2 [place]/monitors were applied. Light sedation administered as required, patient responsive throughout. A 166mm 21ga EchoStim needle was used. Sterile prep and drape,hand hygiene and sterile gloves were used.  Negative aspiration and negative test dose prior to incremental administration of local anesthetic. 1% Lidocaine for skin wheal, 4 ml. Total LA: 76ml - Exparel 64ml & 0.5% Bupivicaine 73ml. U/S images stored in chart. The patient tolerated the procedure well.

## 2019-03-24 NOTE — Anesthesia Procedure Notes (Signed)
Procedure Name: Intubation Date/Time: 03/24/2019 10:47 AM Performed by: Caryl Asp, CRNA Pre-anesthesia Checklist: Patient identified, Patient being monitored, Timeout performed, Emergency Drugs available and Suction available Patient Re-evaluated:Patient Re-evaluated prior to induction Oxygen Delivery Method: Circle system utilized Preoxygenation: Pre-oxygenation with 100% oxygen Induction Type: IV induction Ventilation: Mask ventilation without difficulty Laryngoscope Size: Mac, 3 and McGraph Grade View: Grade I Tube type: Oral Tube size: 7.5 mm Number of attempts: 1 Airway Equipment and Method: Stylet and Video-laryngoscopy Placement Confirmation: ETT inserted through vocal cords under direct vision,  positive ETCO2 and breath sounds checked- equal and bilateral Secured at: 22 cm Tube secured with: Tape Dental Injury: Teeth and Oropharynx as per pre-operative assessment

## 2019-03-24 NOTE — Discharge Instructions (Addendum)
Orthopedic discharge instructions: Keep dressing dry and intact.  May shower after dressing changed on post-op day #4 (Monday).  Cover staples with Band-Aids after drying off. Apply ice frequently to shoulder. Take ibuprofen 600-800 mg TID with meals for 7-10 days, then as necessary. Take oxycodone as prescribed when needed.  May supplement with ES Tylenol if necessary. Keep shoulder immobilizer on at all times except may remove for bathing purposes. Follow-up in 10-14 days or as scheduled.  AMBULATORY SURGERY  DISCHARGE INSTRUCTIONS   1) The drugs that you were given will stay in your system until tomorrow so for the next 24 hours you should not:  A) Drive an automobile B) Make any legal decisions C) Drink any alcoholic beverage   2) You may resume regular meals tomorrow.  Today it is better to start with liquids and gradually work up to solid foods.  You may eat anything you prefer, but it is better to start with liquids, then soup and crackers, and gradually work up to solid foods.   3) Please notify your doctor immediately if you have any unusual bleeding, trouble breathing, redness and pain at the surgery site, drainage, fever, or pain not relieved by medication.    4) Additional Instructions:      Interscalene Nerve Block (ISNB)             With Exparel Discharge Instructions                Bloqueo interescalnico del nervio (ISBN) con Exparel   1.  Usted ha recibido un bloqueo interescalnico del nervio con Exparel para su ciruga.  2.  Los bloqueos de los nervios afectan a muchos tipos de nervios, incluidos los nervios       que controlan el movimiento, el dolor y la sensacin normal. Usted puede                      experimentar sensaciones como entumecimiento, hormigueo, pesadez, debilidad      o la incapacidad de mover su brazo o la sensacin de que su brazo se ha dormido  3. Un bloqueo del nervio con Exparel puede durar [employer]. Por lo general, la                 debilidad desaparece primero. El hormigueo y la pesadez usualmente      desaparecen despus. Finalmente puede comenzar a Dentist. Tenga en                cuenta que esto puede ocurrir en cualquier orden. Una vez que el bloqueo     del nervio comienza a dejar de Chief of Staff, generalmente desaparece en 60                 minutos.  4. El bloqueo interescalnico del nervio (ISNB) puede causar leve dificultad para                 respirar, una voz ronca, visin borrosa, pupilas desiguales o cada de la cara en      el mismo lado del bloqueo del nervio. Estos sntomas generalmente se resuelven a         medida que disminuye el adormecimiento. Muy raramente el procedimiento en s      puede causar convulsiones leves.  5. Si es necesario, su cirujano le dar una receta para medicamentos para Conservation officer, historic buildings.             Tomar aproximadamente  32 minutos para que el medicamento oral para el dolor      haga completamente su efecto. Por lo tanto, se recomienda que comience a tomar      este medicamento antes de que el bloqueo del nervio comience a perder su      efecto o cuando empiece a Dentist.  6. Tome su medicamento para Conservation officer, historic buildings solo segn lo recetado. Los medicamentos      para el dolor pueden causar sedacin y Equities trader la respiracin si toma ms de lo          que necesita para el nivel de dolor que tiene.  7. La nusea es un efecto secundario comn de muchos medicamentos para Conservation officer, historic buildings.        Es aconsejable comer algo antes de tomar su medicamento para el dolor para      prevenir las nuseas.  8. Despus de un bloqueo interescalnico del nervio, usted no puede Education officer, environmental,         presin o temperaturas extremas en el brazo afectado. Debido a que su brazo      est adormecido, existe un mayor riesgo de lesiones. Para disminuir la posibilidad de     lesiones, por favor practique lo siguiente:   a. Mientras est despierto, cambie la posicin de su brazo con frecuencia para                     evitar demasiada presin en cualquier rea durante perodos de tiempo                  prolongados.   b. Si tiene un yeso o un vendaje ajustado, revise el color o los dedos de sus      [employer]. Llame a su cirujano si aparece alguna decoloracin      (blanca o azul).   c. Si le dan un cabestrillo para usar antes de irse a casa, por favor selo en todo      momento hasta que se haya pasado el efecto del bloqueo por completo. No       se levante por la noche sin su cabestrillo.   d. Por favor comunquese con la oficina de los anestesilogos de [place] o con     su cirujano si no comienza a Secretary/administrator sensibilidad despus de 7 das de la      [place]. Se puede comunicar con la oficina de los anestesilogos llamando al      Departamento de [place] (Same Day Surgery Department) de       lunes a viernes, de 6 am a 4 pm al [phone].   e. Si usted tiene problemas o inquietudes, por favor comunquese con el         consultorio de su cirujano.   f. Si experimenta dificultad respiratoria severa o prolongada, vaya al       departamento de emergencias ms cercano.   Please contact your physician with any problems or Same Day Surgery at [phone], Monday through Friday 6 am to 4 pm, or  at [facility] number at [phone].

## 2019-03-24 NOTE — Progress Notes (Signed)
Patient report's numbness in his right thumb. Able to move fingers. Pulses intact

## 2019-03-24 NOTE — Op Note (Signed)
03/24/2019  12:32 PM  Patient:   Terry Long  Pre-Op Diagnosis:   Impingement/tendinopathy with nontraumatic full-thickness rotator cuff tear and chronically torn biceps tendon, right shoulder.  Post-Op Diagnosis:   Impingement/tendinopathy with nontraumatic full-thickness rotator cuff tear, extensive synovitis, and chronically torn and retracted biceps tendon, right shoulder.  Procedure:   Extensive arthroscopic debridement, arthroscopic subacromial decompression, and mini-open rotator cuff repair using [employer]hew Regeneten patch, right shoulder.  Anesthesia:   General endotracheal with interscalene block using Exparel placed preoperatively by the anesthesiologist.  Surgeon:   Pascal Lux, MD  Assistant:   Cameron Proud, PA-C  Findings:   As above.  There was an extensive articular sided partial-thickness tear involving the superior and mid insertional fibers of the subscapularis tendon, as well as a full-thickness tear of the anterior insertional fibers of the supraspinatus tendon measuring 1.2 x 1.5 cm.  The remainder of the rotator cuff was in satisfactory condition.  There was mild degenerative fraying of the anterior and superior labrum without frank detachment from the glenoid.  There also was significant synovitis anteriorly, superiorly, and posterior superiorly.  They were grade 1-2 chondromalacial changes involving the central portion of the glenoid.  Otherwise, the articular surfaces were in excellent condition.  The biceps tendon was chronically torn and retracted, leaving an approximately 1 cm stump attached to the labrum.  Complications:   None  Fluids:   700 cc  Estimated blood loss:   10 cc  Tourniquet time:   None  Drains:   None  Closure:   Staples      Brief clinical note:   The patient is a 69 year old male with a long history of gradually worsening right shoulder pain. The patient's symptoms have progressed despite medications, activity  modification, etc. The patient's history and examination are consistent with impingement/tendinopathy with a full-thickness rotator cuff tear. These findings were confirmed by MRI scan. The patient presents at this time for definitive management of these shoulder symptoms.  Procedure:   The patient underwent placement of an interscalene block using Exparel by the anesthesiologist in the preoperative holding area before being brought into the operating room and lain in the supine position. The patient then underwent general endotracheal intubation and anesthesia before being repositioned in the beach chair position using the beach chair positioner. The right shoulder and upper extremity were prepped with ChloraPrep solution before being draped sterilely. Preoperative antibiotics were administered. A timeout was performed to confirm the proper surgical site before the expected portal sites and incision site were injected with 0.5% Sensorcaine with epinephrine. A posterior portal was created and the glenohumeral joint thoroughly inspected with the findings as described above. An anterior portal was created using an outside-in technique. The labrum and rotator cuff were further probed, again confirming the above-noted findings. The areas of labral fraying were debrided back to stable margins using the full-radius resector, as were areas of extensive synovitis and the torn margins of the rotator cuff. The ArthroCare wand was inserted and used to debride the biceps tendon stump from its labral anchor.  It also was used to obtain hemostasis as well as to "anneal" the labrum superiorly and anteriorly. The instruments were removed from the joint after suctioning the excess fluid.  The camera was repositioned through the posterior portal into the subacromial space. A separate lateral portal was created using an outside-in technique. The 3.5 mm full-radius resector was introduced and used to perform a subtotal bursectomy.  The ArthroCare wand was  then inserted and used to remove the periosteal tissue off the undersurface of the anterior third of the acromion as well as to recess the coracoacromial ligament from its attachment along the anterior and lateral margins of the acromion. The 4.0 mm acromionizing bur was introduced and used to complete the decompression by removing the undersurface of the anterior third of the acromion. The full radius resector was reintroduced to remove any residual bony debris before the ArthroCare wand was reintroduced to obtain hemostasis. The instruments were then removed from the subacromial space after suctioning the excess fluid.  An approximately 4-5 cm incision was made over the anterolateral aspect of the shoulder beginning at the anterolateral corner of the acromion and extending distally in line with the bicipital groove. This incision was carried down through the subcutaneous tissues to expose the deltoid fascia. The raphae between the anterior and middle thirds was identified and this plane developed to provide access into the subacromial space. Additional bursal tissues were debrided sharply using Metzenbaum scissors. The rotator cuff tear involving the supraspinatus tendon was readily identified.  The subscapularis tendon tear was approached first.  The bursal sided fibers were intact so an incision was made in line with the fibers to expose the underlying denuded lesser tuberosity. A single Biomet 2.9 mm JuggerKnot anchor was inserted and both sets of sutures passed through the subscapularis tendon and tied securely. These sutures were then brought back laterally and secured using a single Eldred knotless Regenasorb anchor to create a two-layer closure. Several #0 Ethibond interrupted sutures were used to reapproximate the longitudinal split created in the fibers to complete the repair.   Attention was then directed to the supraspinatus tendon. The margins of this  tear were debrided sharply with a #15 blade and the exposed greater tuberosity roughened with a rongeur. The tear was repaired using two Biomet 2.9 mm JuggerKnot anchors. These sutures were then brought back laterally and secured using a single [place]io knotless Regenasorb anchor to create a two-layer closure. A medium sized [place] patch was then applied over the repair site and secured using the appropriate soft tissue and bone staples. An apparent watertight closure was obtained.  The wound was copiously irrigated with sterile saline solution before the deltoid raphae was reapproximated using 2-0 Vicryl interrupted sutures. The subcutaneous tissues were closed in two layers using 2-0 Vicryl interrupted sutures before the skin was closed using staples. The portal sites also were closed using staples. A sterile bulky dressing was applied to the shoulder before the arm was placed into a shoulder immobilizer. The patient was then awakened, extubated, and returned to the recovery room in satisfactory condition after tolerating the procedure well.

## 2019-03-29 DIAGNOSIS — M25511 Pain in right shoulder: Secondary | ICD-10-CM | POA: Diagnosis not present

## 2019-03-29 DIAGNOSIS — M25411 Effusion, right shoulder: Secondary | ICD-10-CM | POA: Diagnosis not present

## 2019-03-31 NOTE — Anesthesia Postprocedure Evaluation (Signed)
Anesthesia Post Note  Patient: Environmental education officer  Procedure(s) Performed: RIGHT SHOULDER ARTHROSCOPY WITH DEBRIDEMENT, DECOMPRESSION, AND ROTATOR CUFF REPAIR (Right Shoulder)  Patient location during evaluation: PACU Anesthesia Type: General Level of consciousness: awake and alert Pain management: pain level controlled Vital Signs Assessment: post-procedure vital signs reviewed and stable Respiratory status: spontaneous breathing, nonlabored ventilation and respiratory function stable Cardiovascular status: blood pressure returned to baseline and stable Postop Assessment: no apparent nausea or vomiting Anesthetic complications: no     Last Vitals:  Vitals:   03/24/19 1457 03/24/19 1515  BP: (!) 143/82 (!) 154/80  Pulse: 70 70  Resp:    Temp: (!) 36.1 C   SpO2: 95% 98%    Last Pain:  Vitals:   03/24/19 1457  TempSrc: Temporal  PainSc: 0-No pain                 Alphonsus Sias

## 2019-04-06 DIAGNOSIS — M25411 Effusion, right shoulder: Secondary | ICD-10-CM | POA: Diagnosis not present

## 2019-04-06 DIAGNOSIS — M25511 Pain in right shoulder: Secondary | ICD-10-CM | POA: Diagnosis not present

## 2019-04-13 DIAGNOSIS — M25411 Effusion, right shoulder: Secondary | ICD-10-CM | POA: Diagnosis not present

## 2019-04-13 DIAGNOSIS — M25511 Pain in right shoulder: Secondary | ICD-10-CM | POA: Diagnosis not present

## 2019-04-18 DIAGNOSIS — R300 Dysuria: Secondary | ICD-10-CM | POA: Diagnosis not present

## 2019-04-18 DIAGNOSIS — N390 Urinary tract infection, site not specified: Secondary | ICD-10-CM | POA: Diagnosis not present

## 2019-04-20 DIAGNOSIS — M25511 Pain in right shoulder: Secondary | ICD-10-CM | POA: Diagnosis not present

## 2019-04-20 DIAGNOSIS — M25411 Effusion, right shoulder: Secondary | ICD-10-CM | POA: Diagnosis not present

## 2019-04-27 DIAGNOSIS — M25511 Pain in right shoulder: Secondary | ICD-10-CM | POA: Diagnosis not present

## 2019-04-27 DIAGNOSIS — M25411 Effusion, right shoulder: Secondary | ICD-10-CM | POA: Diagnosis not present

## 2019-05-02 DIAGNOSIS — M25511 Pain in right shoulder: Secondary | ICD-10-CM | POA: Diagnosis not present

## 2019-05-02 DIAGNOSIS — M25411 Effusion, right shoulder: Secondary | ICD-10-CM | POA: Diagnosis not present

## 2019-05-09 DIAGNOSIS — M25411 Effusion, right shoulder: Secondary | ICD-10-CM | POA: Diagnosis not present

## 2019-05-09 DIAGNOSIS — M25511 Pain in right shoulder: Secondary | ICD-10-CM | POA: Diagnosis not present

## 2019-05-18 DIAGNOSIS — M25611 Stiffness of right shoulder, not elsewhere classified: Secondary | ICD-10-CM | POA: Diagnosis not present

## 2019-05-25 DIAGNOSIS — M25611 Stiffness of right shoulder, not elsewhere classified: Secondary | ICD-10-CM | POA: Diagnosis not present

## 2019-05-25 DIAGNOSIS — R739 Hyperglycemia, unspecified: Secondary | ICD-10-CM | POA: Diagnosis not present

## 2019-05-25 DIAGNOSIS — M7581 Other shoulder lesions, right shoulder: Secondary | ICD-10-CM | POA: Diagnosis not present

## 2019-05-25 DIAGNOSIS — K219 Gastro-esophageal reflux disease without esophagitis: Secondary | ICD-10-CM | POA: Diagnosis not present

## 2019-05-25 DIAGNOSIS — I10 Essential (primary) hypertension: Secondary | ICD-10-CM | POA: Diagnosis not present

## 2019-05-25 DIAGNOSIS — Z9889 Other specified postprocedural states: Secondary | ICD-10-CM | POA: Diagnosis not present

## 2019-05-25 DIAGNOSIS — Z0000 Encounter for general adult medical examination without abnormal findings: Secondary | ICD-10-CM | POA: Diagnosis not present

## 2019-05-25 DIAGNOSIS — K5900 Constipation, unspecified: Secondary | ICD-10-CM | POA: Diagnosis not present

## 2019-05-25 DIAGNOSIS — R062 Wheezing: Secondary | ICD-10-CM | POA: Diagnosis not present

## 2019-06-01 DIAGNOSIS — M25611 Stiffness of right shoulder, not elsewhere classified: Secondary | ICD-10-CM | POA: Diagnosis not present

## 2019-06-08 DIAGNOSIS — M25611 Stiffness of right shoulder, not elsewhere classified: Secondary | ICD-10-CM | POA: Diagnosis not present

## 2019-06-14 DIAGNOSIS — M25611 Stiffness of right shoulder, not elsewhere classified: Secondary | ICD-10-CM | POA: Diagnosis not present

## 2019-07-26 DIAGNOSIS — H40153 Residual stage of open-angle glaucoma, bilateral: Secondary | ICD-10-CM | POA: Diagnosis not present

## 2019-09-09 DIAGNOSIS — L249 Irritant contact dermatitis, unspecified cause: Secondary | ICD-10-CM | POA: Diagnosis not present

## 2019-09-24 ENCOUNTER — Other Ambulatory Visit: Payer: Self-pay | Admitting: Urology

## 2019-09-24 DIAGNOSIS — N401 Enlarged prostate with lower urinary tract symptoms: Secondary | ICD-10-CM

## 2019-09-29 ENCOUNTER — Other Ambulatory Visit: Payer: Self-pay | Admitting: Urology

## 2019-09-29 DIAGNOSIS — N401 Enlarged prostate with lower urinary tract symptoms: Secondary | ICD-10-CM

## 2019-10-03 ENCOUNTER — Other Ambulatory Visit: Payer: Self-pay | Admitting: *Deleted

## 2019-10-03 MED ORDER — OXYBUTYNIN CHLORIDE 5 MG PO TABS: ORAL_TABLET | ORAL | 0 refills | Status: DC

## 2019-10-27 ENCOUNTER — Ambulatory Visit: Payer: PPO | Admitting: Urology

## 2019-11-02 ENCOUNTER — Ambulatory Visit (INDEPENDENT_AMBULATORY_CARE_PROVIDER_SITE_OTHER): Payer: PPO | Admitting: Urology

## 2019-11-02 ENCOUNTER — Other Ambulatory Visit: Payer: Self-pay

## 2019-11-02 ENCOUNTER — Encounter: Payer: Self-pay | Admitting: Urology

## 2019-11-02 VITALS — BP 149/52 | HR 46 | Ht 66.0 in | Wt 170.0 lb

## 2019-11-02 DIAGNOSIS — R350 Frequency of micturition: Secondary | ICD-10-CM

## 2019-11-02 DIAGNOSIS — N401 Enlarged prostate with lower urinary tract symptoms: Secondary | ICD-10-CM

## 2019-11-02 DIAGNOSIS — R351 Nocturia: Secondary | ICD-10-CM

## 2019-11-02 LAB — BLADDER SCAN AMB NON-IMAGING: Scan Result: 125

## 2019-11-02 MED ORDER — TAMSULOSIN HCL 0.4 MG PO CAPS: 0.4000 mg | ORAL_CAPSULE | Freq: Every day | ORAL | 5 refills | Status: AC

## 2019-11-02 NOTE — Progress Notes (Signed)
11/02/2019 9:53 AM   Terry Long 09-23-1950 [id number]  Referring provider: Tracie Harrier, MD [address] [facility] [place],  [place] [address]  Chief Complaint  Patient presents with  . Benign Prostatic Hypertrophy    Urologic history: 1.BPH with lower urinary tract symptoms -PVP 2005 -TURP 2011 at [facility] -TURP 07/2012 by Dr. Jacqlyn Larsen in [place] prostatic urethral calculus, internal urethrotomy and TURP 03/2013 by Dr. Jacqlyn Larsen   HPI: 69 y.o.male presents for annual follow-up.   Remains on finasteride  Since his visit last year has noticed increased daytime frequency, decreased force and caliber of his urinary stream  He was given a trial of oxybutynin at bedtime for nocturia x3-for which he states was not effective.  Denies prior sleep apnea testing, history of sleep apnea or snoring  Denies recurrent gross hematuria  IPSS 13/35     PMH: Past Medical History:  Diagnosis Date  . GERD (gastroesophageal reflux disease)   . Hypertension   . Peptic ulcer     Surgical History: Past Surgical History:  Procedure Laterality Date  . COLONOSCOPY    . COLONOSCOPY N/A 10/24/2016   Procedure: COLONOSCOPY;  Surgeon: Manya Silvas, MD;  Location: [facility] ENDOSCOPY;  Service: Endoscopy;  Laterality: N/A;  . ESOPHAGOGASTRODUODENOSCOPY    . ESOPHAGOGASTRODUODENOSCOPY (EGD) WITH PROPOFOL N/A 10/24/2016   Procedure: ESOPHAGOGASTRODUODENOSCOPY (EGD) WITH PROPOFOL;  Surgeon: Manya Silvas, MD;  Location: [facility] ENDOSCOPY;  Service: Endoscopy;  Laterality: N/A;  . PROSTATE SURGERY    . SHOULDER ARTHROSCOPY WITH SUBACROMIAL DECOMPRESSION AND OPEN ROTATOR C Right 03/24/2019   Procedure: RIGHT SHOULDER ARTHROSCOPY WITH DEBRIDEMENT, DECOMPRESSION, AND ROTATOR CUFF REPAIR;  Surgeon: Corky Mull, MD;  Location: [facility] ORS;  Service: Orthopedics;  Laterality: Right;    Home Medications:  Allergies as of 11/02/2019   No Known  Allergies     Medication List       Accurate as of November 02, 2019  9:53 AM. If you have any questions, ask your nurse or doctor.        amLODipine 5 MG tablet Commonly known as: NORVASC Take 5 mg by mouth daily.   aspirin EC 81 MG tablet Take 81 mg by mouth daily.   latanoprost 0.005 % ophthalmic solution Commonly known as: XALATAN Place 1 drop into both eyes at bedtime.   losartan 50 MG tablet Commonly known as: COZAAR Take 50 mg by mouth daily.   omeprazole 40 MG capsule Commonly known as: PRILOSEC Take 40 mg by mouth daily.   oxybutynin 5 MG tablet Commonly known as: DITROPAN TAKE 1 TABLET BY MOUTH AT BEDTIME   oxyCODONE 5 MG immediate release tablet Commonly known as: Roxicodone Take 1-2 tablets (5-10 mg total) by mouth every 4 (four) hours as needed for moderate pain or severe pain.       Allergies: No Known Allergies  Family History: History reviewed. No pertinent family history.  Social History:  reports that he has never smoked. He has never used smokeless tobacco. He reports that he does not drink alcohol and does not use drugs.   Physical Exam: BP (!) 149/52   Pulse (!) 46   Ht 5\' 6"  (1.676 m)   Wt 170 lb (77.1 kg)   BMI 27.44 kg/m   Constitutional:  Alert and oriented, No acute distress. HEENT: [place] AT, moist mucus membranes.  Trachea midline, no masses. Cardiovascular: No clubbing, cyanosis, or edema. Respiratory: Normal respiratory effort, no increased work of breathing. GI: Abdomen is  soft, nontender, nondistended, no abdominal masses GU: Prostate 50 g, smooth without nodules Lymph: No cervical or inguinal lymphadenopathy. Skin: No rashes, bruises or suspicious lesions. Neurologic: Grossly intact, no focal deficits, moving all 4 extremities. Psychiatric: Normal mood and affect.   Assessment & Plan:    1. Benign prostatic hyperplasia with urinary frequency  Bladder scan PVR increased at 125 mL  Worsening obstructive and storage  related voiding symptoms  Will add tamsulosin 0.4 mg daily  Urinalysis today though dip negative did show 6-10 WBC; urine was sent for culture  Call back 1 month regarding efficacy of tamsulosin  2.  Nocturia  Consider sleep study for evaluation of his nocturia  We also discussed possibility of nocturnal polyuria  3.  Prostate cancer screening  He states he is scheduled to have his PSA drawn next month with Dr. Josiah Lobo, MD  [place] [address] [place], [place] [address] [phone]

## 2019-11-03 LAB — MICROSCOPIC EXAMINATION

## 2019-11-03 LAB — URINALYSIS, COMPLETE
Bilirubin, UA: NEGATIVE
Ketones, UA: NEGATIVE
Leukocytes,UA: NEGATIVE
Nitrite, UA: NEGATIVE
Protein,UA: NEGATIVE
Specific Gravity, UA: 1.025 (ref 1.005–1.030)
Urobilinogen, Ur: 0.2 mg/dL (ref 0.2–1.0)
pH, UA: 5.5 (ref 5.0–7.5)

## 2019-11-05 LAB — CULTURE, URINE COMPREHENSIVE

## 2019-11-06 ENCOUNTER — Other Ambulatory Visit: Payer: Self-pay | Admitting: Urology

## 2019-11-06 MED ORDER — AMOXICILLIN 875 MG PO TABS: 875.0000 mg | ORAL_TABLET | Freq: Two times a day (BID) | ORAL | 0 refills | Status: DC

## 2019-11-07 ENCOUNTER — Telehealth: Payer: Self-pay | Admitting: *Deleted

## 2019-11-07 NOTE — Telephone Encounter (Signed)
Notified patient as instructed, patient pleased. Discussed follow-up appointments, patient agrees  

## 2019-11-07 NOTE — Telephone Encounter (Signed)
-----   Message from Abbie Sons, MD sent at 11/06/2019  9:51 AM EDT ----- Urine culture was growing bacteria.  Antibiotic Rx was sent to pharmacy to see if he notices any improvement in his urinary symptoms

## 2019-11-08 DIAGNOSIS — H9319 Tinnitus, unspecified ear: Secondary | ICD-10-CM | POA: Diagnosis not present

## 2019-11-08 DIAGNOSIS — H6980 Other specified disorders of Eustachian tube, unspecified ear: Secondary | ICD-10-CM | POA: Diagnosis not present

## 2019-11-08 DIAGNOSIS — H6123 Impacted cerumen, bilateral: Secondary | ICD-10-CM | POA: Diagnosis not present

## 2019-11-08 DIAGNOSIS — H90A21 Sensorineural hearing loss, unilateral, right ear, with restricted hearing on the contralateral side: Secondary | ICD-10-CM | POA: Diagnosis not present

## 2019-11-08 DIAGNOSIS — H903 Sensorineural hearing loss, bilateral: Secondary | ICD-10-CM | POA: Diagnosis not present

## 2019-11-22 ENCOUNTER — Other Ambulatory Visit: Payer: Self-pay | Admitting: Physician Assistant

## 2019-11-22 DIAGNOSIS — H9041 Sensorineural hearing loss, unilateral, right ear, with unrestricted hearing on the contralateral side: Secondary | ICD-10-CM

## 2019-11-22 DIAGNOSIS — H9319 Tinnitus, unspecified ear: Secondary | ICD-10-CM | POA: Diagnosis not present

## 2019-11-22 DIAGNOSIS — H903 Sensorineural hearing loss, bilateral: Secondary | ICD-10-CM | POA: Diagnosis not present

## 2019-11-22 DIAGNOSIS — H90A21 Sensorineural hearing loss, unilateral, right ear, with restricted hearing on the contralateral side: Secondary | ICD-10-CM | POA: Diagnosis not present

## 2019-11-22 DIAGNOSIS — H6980 Other specified disorders of Eustachian tube, unspecified ear: Secondary | ICD-10-CM | POA: Diagnosis not present

## 2019-11-23 DIAGNOSIS — R739 Hyperglycemia, unspecified: Secondary | ICD-10-CM | POA: Diagnosis not present

## 2019-11-23 DIAGNOSIS — R062 Wheezing: Secondary | ICD-10-CM | POA: Diagnosis not present

## 2019-11-23 DIAGNOSIS — I10 Essential (primary) hypertension: Secondary | ICD-10-CM | POA: Diagnosis not present

## 2019-11-23 DIAGNOSIS — Z125 Encounter for screening for malignant neoplasm of prostate: Secondary | ICD-10-CM | POA: Diagnosis not present

## 2019-11-23 DIAGNOSIS — K5900 Constipation, unspecified: Secondary | ICD-10-CM | POA: Diagnosis not present

## 2019-11-30 DIAGNOSIS — K219 Gastro-esophageal reflux disease without esophagitis: Secondary | ICD-10-CM | POA: Diagnosis not present

## 2019-11-30 DIAGNOSIS — Z23 Encounter for immunization: Secondary | ICD-10-CM | POA: Diagnosis not present

## 2019-11-30 DIAGNOSIS — E119 Type 2 diabetes mellitus without complications: Secondary | ICD-10-CM | POA: Diagnosis not present

## 2019-11-30 DIAGNOSIS — Z9889 Other specified postprocedural states: Secondary | ICD-10-CM | POA: Diagnosis not present

## 2019-11-30 DIAGNOSIS — I10 Essential (primary) hypertension: Secondary | ICD-10-CM | POA: Diagnosis not present

## 2019-12-07 ENCOUNTER — Other Ambulatory Visit: Payer: Self-pay

## 2019-12-07 ENCOUNTER — Ambulatory Visit
Admission: RE | Admit: 2019-12-07 | Discharge: 2019-12-07 | Disposition: A | Discharge status: Home / Self Care | Payer: PPO | Source: Ambulatory Visit | Attending: Physician Assistant | Admitting: Physician Assistant

## 2019-12-07 DIAGNOSIS — J0120 Acute ethmoidal sinusitis, unspecified: Secondary | ICD-10-CM | POA: Diagnosis not present

## 2019-12-07 DIAGNOSIS — J3489 Other specified disorders of nose and nasal sinuses: Secondary | ICD-10-CM | POA: Diagnosis not present

## 2019-12-07 DIAGNOSIS — H9041 Sensorineural hearing loss, unilateral, right ear, with unrestricted hearing on the contralateral side: Secondary | ICD-10-CM | POA: Diagnosis not present

## 2019-12-07 DIAGNOSIS — J322 Chronic ethmoidal sinusitis: Secondary | ICD-10-CM | POA: Diagnosis not present

## 2019-12-07 MED ORDER — GADOBUTROL 1 MMOL/ML IV SOLN
7.0000 mL | Freq: Once | INTRAVENOUS | Status: AC | PRN
  Administered 2019-12-07: 7 mL via INTRAVENOUS

## 2019-12-24 DIAGNOSIS — R300 Dysuria: Secondary | ICD-10-CM | POA: Diagnosis not present

## 2019-12-24 DIAGNOSIS — N390 Urinary tract infection, site not specified: Secondary | ICD-10-CM | POA: Diagnosis not present

## 2019-12-27 DIAGNOSIS — H40153 Residual stage of open-angle glaucoma, bilateral: Secondary | ICD-10-CM | POA: Diagnosis not present

## 2020-01-02 ENCOUNTER — Other Ambulatory Visit: Payer: Self-pay | Admitting: Urology

## 2020-01-16 DIAGNOSIS — J322 Chronic ethmoidal sinusitis: Secondary | ICD-10-CM | POA: Diagnosis not present

## 2020-01-16 DIAGNOSIS — H6980 Other specified disorders of Eustachian tube, unspecified ear: Secondary | ICD-10-CM | POA: Diagnosis not present

## 2020-01-16 DIAGNOSIS — H90A21 Sensorineural hearing loss, unilateral, right ear, with restricted hearing on the contralateral side: Secondary | ICD-10-CM | POA: Diagnosis not present

## 2020-01-16 DIAGNOSIS — H9319 Tinnitus, unspecified ear: Secondary | ICD-10-CM | POA: Diagnosis not present

## 2020-01-17 ENCOUNTER — Other Ambulatory Visit: Payer: Self-pay | Admitting: Urology

## 2020-02-06 ENCOUNTER — Other Ambulatory Visit
Admission: RE | Admit: 2020-02-06 | Discharge: 2020-02-06 | Disposition: A | Discharge status: Home / Self Care | Payer: PPO | Source: Ambulatory Visit | Attending: Surgery | Admitting: Surgery

## 2020-02-06 DIAGNOSIS — M25511 Pain in right shoulder: Secondary | ICD-10-CM | POA: Diagnosis not present

## 2020-02-06 DIAGNOSIS — M7551 Bursitis of right shoulder: Secondary | ICD-10-CM | POA: Insufficient documentation

## 2020-02-06 DIAGNOSIS — Z9889 Other specified postprocedural states: Secondary | ICD-10-CM | POA: Insufficient documentation

## 2020-02-06 DIAGNOSIS — M25411 Effusion, right shoulder: Secondary | ICD-10-CM | POA: Diagnosis not present

## 2020-02-06 LAB — SYNOVIAL CELL COUNT + DIFF, W/ CRYSTALS
Crystals, Fluid: NONE SEEN
Eosinophils-Synovial: 0 %
Lymphocytes-Synovial Fld: 5 %
Monocyte-Macrophage-Synovial Fluid: 6 %
Neutrophil, Synovial: 89 %
WBC, Synovial: 30435 /mm3 — ABNORMAL HIGH (ref 0–200)

## 2020-04-18 ENCOUNTER — Other Ambulatory Visit: Payer: Self-pay

## 2020-04-18 ENCOUNTER — Ambulatory Visit: Payer: PPO | Admitting: Urology

## 2020-04-18 ENCOUNTER — Encounter: Payer: Self-pay | Admitting: Urology

## 2020-04-18 VITALS — BP 143/70 | HR 82 | Ht 69.0 in | Wt 170.0 lb

## 2020-04-18 DIAGNOSIS — N401 Enlarged prostate with lower urinary tract symptoms: Secondary | ICD-10-CM | POA: Diagnosis not present

## 2020-04-18 DIAGNOSIS — R350 Frequency of micturition: Secondary | ICD-10-CM | POA: Diagnosis not present

## 2020-04-18 LAB — BLADDER SCAN AMB NON-IMAGING: Scan Result: 54

## 2020-04-18 NOTE — Progress Notes (Signed)
04/18/2020 10:13 AM   Terry Long 1950/10/19 [id number]  Referring provider: Tracie Harrier, MD [address] [facility],  [place] [address]  Chief Complaint  Patient presents with  . Benign Prostatic Hypertrophy    Urologic history: 1.BPH with lower urinary tract symptoms -PVP 2005 -TURP 2011 at [facility] -TURP 07/2012 by Dr. Jacqlyn Larsen in [place] prostatic urethral calculus, internal urethrotomy and TURP 03/2013 by Dr. Jacqlyn Larsen  HPI: 70 y.o. male presents for semiannual follow-up.   Started on tamsulosin at October 2021 visit for PVR 125 mL and worsening obstructive/storage related voiding symptoms  States he started tamsulosin however discontinued because it made his joints ache  Has also discontinued finasteride  Today he states he has no bothersome lower urinary tract symptoms  Denies dysuria, gross hematuria  Denies flank, abdominal or pelvic pain  PSA performed by PCP 11/2019 was stable at 1.65   PMH: Past Medical History:  Diagnosis Date  . GERD (gastroesophageal reflux disease)   . Hypertension   . Peptic ulcer     Surgical History: Past Surgical History:  Procedure Laterality Date  . COLONOSCOPY    . COLONOSCOPY N/A 10/24/2016   Procedure: COLONOSCOPY;  Surgeon: Manya Silvas, MD;  Location: [facility] ENDOSCOPY;  Service: Endoscopy;  Laterality: N/A;  . ESOPHAGOGASTRODUODENOSCOPY    . ESOPHAGOGASTRODUODENOSCOPY (EGD) WITH PROPOFOL N/A 10/24/2016   Procedure: ESOPHAGOGASTRODUODENOSCOPY (EGD) WITH PROPOFOL;  Surgeon: Manya Silvas, MD;  Location: [facility] ENDOSCOPY;  Service: Endoscopy;  Laterality: N/A;  . PROSTATE SURGERY    . SHOULDER ARTHROSCOPY WITH SUBACROMIAL DECOMPRESSION AND OPEN ROTATOR C Right 03/24/2019   Procedure: RIGHT SHOULDER ARTHROSCOPY WITH DEBRIDEMENT, DECOMPRESSION, AND ROTATOR CUFF REPAIR;  Surgeon: Corky Mull, MD;  Location: [facility] ORS;  Service: Orthopedics;  Laterality: Right;     Home Medications:  Allergies as of 04/18/2020   No Known Allergies     Medication List       Accurate as of April 18, 2020 10:13 AM. If you have any questions, ask your nurse or doctor.        STOP taking these medications   amoxicillin 875 MG tablet Commonly known as: AMOXIL Stopped by: Abbie Sons, MD   oxyCODONE 5 MG immediate release tablet Commonly known as: Roxicodone Stopped by: Abbie Sons, MD     TAKE these medications   amLODipine 5 MG tablet Commonly known as: NORVASC Take 5 mg by mouth daily.   aspirin EC 81 MG tablet Take 81 mg by mouth daily.   latanoprost 0.005 % ophthalmic solution Commonly known as: XALATAN Place 1 drop into both eyes at bedtime.   losartan 50 MG tablet Commonly known as: COZAAR Take 50 mg by mouth daily.   metFORMIN 500 MG tablet Commonly known as: GLUCOPHAGE Take by mouth.   omeprazole 40 MG capsule Commonly known as: PRILOSEC Take 40 mg by mouth daily.   tamsulosin 0.4 MG Caps capsule Commonly known as: FLOMAX Take 1 capsule (0.4 mg total) by mouth daily.       Allergies: No Known Allergies  Family History: No family history on file.  Social History:  reports that he has never smoked. He has never used smokeless tobacco. He reports that he does not drink alcohol and does not use drugs.   Physical Exam: There were no vitals taken for this visit.  Constitutional:  Alert and oriented, No acute distress. HEENT: [place] AT, moist mucus membranes.  Trachea midline, no masses. Cardiovascular: No clubbing,  cyanosis, or edema. Respiratory: Normal respiratory effort, no increased work of breathing. GI: Abdomen is soft, nontender, nondistended, no abdominal masses   Assessment & Plan:    1. Benign prostatic hyperplasia with urinary frequency  Bladder scan PVR improved at 54 mL  Presently without bothersome LUTS on finasteride/tamsulosin  I recommended an annual or as needed follow-up however he requested a  6 month follow-up   Abbie Sons, MD  [place] [address] [place], [place] [address] [phone]

## 2020-04-26 DIAGNOSIS — H40153 Residual stage of open-angle glaucoma, bilateral: Secondary | ICD-10-CM | POA: Diagnosis not present

## 2020-05-21 DIAGNOSIS — K219 Gastro-esophageal reflux disease without esophagitis: Secondary | ICD-10-CM | POA: Diagnosis not present

## 2020-05-21 DIAGNOSIS — Z9889 Other specified postprocedural states: Secondary | ICD-10-CM | POA: Diagnosis not present

## 2020-05-21 DIAGNOSIS — I10 Essential (primary) hypertension: Secondary | ICD-10-CM | POA: Diagnosis not present

## 2020-05-21 DIAGNOSIS — E119 Type 2 diabetes mellitus without complications: Secondary | ICD-10-CM | POA: Diagnosis not present

## 2020-06-09 DIAGNOSIS — H6502 Acute serous otitis media, left ear: Secondary | ICD-10-CM | POA: Diagnosis not present

## 2020-06-09 DIAGNOSIS — J0100 Acute maxillary sinusitis, unspecified: Secondary | ICD-10-CM | POA: Diagnosis not present

## 2020-07-15 DIAGNOSIS — R300 Dysuria: Secondary | ICD-10-CM | POA: Diagnosis not present

## 2020-07-15 DIAGNOSIS — N410 Acute prostatitis: Secondary | ICD-10-CM | POA: Diagnosis not present

## 2020-07-25 ENCOUNTER — Ambulatory Visit: Payer: PPO | Admitting: Urology

## 2020-07-25 ENCOUNTER — Encounter: Payer: Self-pay | Admitting: Urology

## 2020-07-25 ENCOUNTER — Other Ambulatory Visit: Payer: Self-pay

## 2020-07-25 VITALS — BP 129/79 | HR 67 | Ht 69.0 in | Wt 166.0 lb

## 2020-07-25 DIAGNOSIS — B9689 Other specified bacterial agents as the cause of diseases classified elsewhere: Secondary | ICD-10-CM | POA: Diagnosis not present

## 2020-07-25 DIAGNOSIS — R350 Frequency of micturition: Secondary | ICD-10-CM

## 2020-07-25 DIAGNOSIS — N418 Other inflammatory diseases of prostate: Secondary | ICD-10-CM | POA: Diagnosis not present

## 2020-07-25 DIAGNOSIS — R310 Gross hematuria: Secondary | ICD-10-CM

## 2020-07-25 DIAGNOSIS — N401 Enlarged prostate with lower urinary tract symptoms: Secondary | ICD-10-CM

## 2020-07-25 NOTE — Progress Notes (Signed)
07/25/2020 2:56 PM   [place] April 16, 1950 [id number]  Referring provider: Tracie Harrier, MD [address] [facility] [place],  [place] [address]  Chief Complaint  Patient presents with   Hematuria    Urologic history: 1.  BPH with lower urinary tract symptoms  -PVP 2005 -TURP 2011 at [facility] -TURP 07/2012 by Dr. Jacqlyn Larsen in [place] prostatic urethral calculus, internal urethrotomy and TURP 03/2013 by Dr. Jacqlyn Larsen  HPI: 70 y.o. male followed for BPH presents for follow-up of recent urgent care visit for UTI.  Last seen April 2022 and was doing well Seen [facility] 07/15/2020 with a 2-day history of dysuria, chills but no fever Previously on tamsulosin but discontinued secondary to joint pain Urinalysis with large blood/moderate leukocytes on microscopy with 10-50 WBC/4-10 RBC and moderate bacteria Treated with a 14-day course of Septra DS Urine culture >100,000 Proteus mirabilis sensitive to Septra He has had symptom resolution and has a few days of antibiotic left He has no complaints today   PMH: Past Medical History:  Diagnosis Date   GERD (gastroesophageal reflux disease)    Hypertension    Peptic ulcer     Surgical History: Past Surgical History:  Procedure Laterality Date   COLONOSCOPY     COLONOSCOPY N/A 10/24/2016   Procedure: COLONOSCOPY;  Surgeon: Manya Silvas, MD;  Location: [facility] ENDOSCOPY;  Service: Endoscopy;  Laterality: N/A;   ESOPHAGOGASTRODUODENOSCOPY     ESOPHAGOGASTRODUODENOSCOPY (EGD) WITH PROPOFOL N/A 10/24/2016   Procedure: ESOPHAGOGASTRODUODENOSCOPY (EGD) WITH PROPOFOL;  Surgeon: Manya Silvas, MD;  Location: [facility] ENDOSCOPY;  Service: Endoscopy;  Laterality: N/A;   PROSTATE SURGERY     SHOULDER ARTHROSCOPY WITH SUBACROMIAL DECOMPRESSION AND OPEN ROTATOR C Right 03/24/2019   Procedure: RIGHT SHOULDER ARTHROSCOPY WITH DEBRIDEMENT, DECOMPRESSION, AND ROTATOR CUFF REPAIR;   Surgeon: Corky Mull, MD;  Location: [facility] ORS;  Service: Orthopedics;  Laterality: Right;    Home Medications:  Allergies as of 07/25/2020   No Known Allergies      Medication List        Accurate as of July 25, 2020  2:56 PM. If you have any questions, ask your nurse or doctor.          amLODipine 5 MG tablet Commonly known as: NORVASC Take 5 mg by mouth daily.   aspirin EC 81 MG tablet Take 81 mg by mouth daily.   latanoprost 0.005 % ophthalmic solution Commonly known as: XALATAN Place 1 drop into both eyes at bedtime.   losartan 50 MG tablet Commonly known as: COZAAR Take 50 mg by mouth daily.   metFORMIN 500 MG tablet Commonly known as: GLUCOPHAGE Take by mouth.   omeprazole 40 MG capsule Commonly known as: PRILOSEC Take 40 mg by mouth daily.   tamsulosin 0.4 MG Caps capsule Commonly known as: FLOMAX Take 1 capsule (0.4 mg total) by mouth daily.        Allergies: No Known Allergies  Family History: History reviewed. No pertinent family history.  Social History:  reports that he has never smoked. He has never used smokeless tobacco. He reports that he does not drink alcohol and does not use drugs.   Physical Exam: BP 129/79   Pulse 67   Ht 5\' 9"  (1.753 m)   Wt 166 lb (75.3 kg)   BMI 24.51 kg/m   Constitutional:  Alert and oriented, No acute distress. HEENT: [place] AT, moist mucus membranes.  Trachea midline, no masses. Cardiovascular: No clubbing, cyanosis,  or edema. Respiratory: Normal respiratory effort, no increased work of breathing. Skin: No rashes, bruises or suspicious lesions. Neurologic: Grossly intact, no focal deficits, moving all 4 extremities. Psychiatric: Normal mood and affect.  Laboratory Data:  Urinalysis Pending  Assessment & Plan:    1.  Bacterial prostatitis Urine culture did grow Proteus and recommend further evaluation Schedule CTU and cystoscopy   2.  BPH with LUTS  3.  Gross hematuria Most likely secondary  to prostatitis   Abbie Sons, MD  [place] [address], [place], [place] [address] [phone]

## 2020-07-27 LAB — URINALYSIS, COMPLETE
Bilirubin, UA: NEGATIVE
Glucose, UA: NEGATIVE
Ketones, UA: NEGATIVE
Leukocytes,UA: NEGATIVE
Nitrite, UA: NEGATIVE
Protein,UA: NEGATIVE
Specific Gravity, UA: 1.03 (ref 1.005–1.030)
Urobilinogen, Ur: 0.2 mg/dL (ref 0.2–1.0)
pH, UA: 5 (ref 5.0–7.5)

## 2020-07-27 LAB — MICROSCOPIC EXAMINATION: Bacteria, UA: NONE SEEN

## 2020-08-14 DIAGNOSIS — H40153 Residual stage of open-angle glaucoma, bilateral: Secondary | ICD-10-CM | POA: Diagnosis not present

## 2020-09-04 ENCOUNTER — Other Ambulatory Visit: Payer: Self-pay

## 2020-09-04 ENCOUNTER — Ambulatory Visit
Admission: RE | Admit: 2020-09-04 | Discharge: 2020-09-04 | Disposition: A | Discharge status: Home / Self Care | Payer: PPO | Source: Ambulatory Visit | Attending: Urology | Admitting: Urology

## 2020-09-04 DIAGNOSIS — K3189 Other diseases of stomach and duodenum: Secondary | ICD-10-CM | POA: Diagnosis not present

## 2020-09-04 DIAGNOSIS — N400 Enlarged prostate without lower urinary tract symptoms: Secondary | ICD-10-CM | POA: Diagnosis not present

## 2020-09-04 DIAGNOSIS — R310 Gross hematuria: Secondary | ICD-10-CM | POA: Diagnosis not present

## 2020-09-04 DIAGNOSIS — K5730 Diverticulosis of large intestine without perforation or abscess without bleeding: Secondary | ICD-10-CM | POA: Diagnosis not present

## 2020-09-04 HISTORY — DX: Type 2 diabetes mellitus without complications: E11.9

## 2020-09-04 LAB — POCT I-STAT CREATININE: Creatinine, Ser: 0.8 mg/dL (ref 0.61–1.24)

## 2020-09-04 MED ORDER — IOHEXOL 350 MG/ML SOLN
100.0000 mL | Freq: Once | INTRAVENOUS | Status: AC | PRN
  Administered 2020-09-04: 100 mL via INTRAVENOUS

## 2020-09-06 ENCOUNTER — Encounter: Payer: Self-pay | Admitting: Urology

## 2020-09-06 ENCOUNTER — Other Ambulatory Visit: Payer: Self-pay

## 2020-09-06 ENCOUNTER — Ambulatory Visit: Payer: PPO | Admitting: Urology

## 2020-09-06 VITALS — BP 142/80 | HR 74 | Ht 70.0 in | Wt 166.0 lb

## 2020-09-06 DIAGNOSIS — R310 Gross hematuria: Secondary | ICD-10-CM

## 2020-09-06 NOTE — Progress Notes (Signed)
   09/06/20  CC:  Chief Complaint  Patient presents with   Cysto    Indications: Acute bacterial prostatitis/gross hematuria  HPI: Refer to my prior note 07/25/2020.  Denies recurrent hematuria or UTI symptoms.  CTU without upper tract abnormalities  Blood pressure (!) 142/80, pulse 74, height '5\' 10"'$  (1.778 m), weight 166 lb (75.3 kg). NED. A&Ox3.   No respiratory distress   Abd soft, NT, ND Normal phallus with bilateral descended testicles  Cystoscopy Procedure Note  Patient identification was confirmed, informed consent was obtained, and patient was prepped using Betadine solution.  Lidocaine jelly was administered per urethral meatus.     Pre-Procedure: - Inspection reveals a normal caliber urethral meatus.  Procedure: The flexible cystoscope was introduced without difficulty - No urethral strictures/lesions are present. -TUR changes with adenoma regrowth distally.  Marked inflammatory changes proximal prostate with scattered calcifications - Normal bladder neck - Bilateral ureteral orifices identified - Bladder mucosa  reveals no ulcers, tumors, or lesions - No bladder stones - No trabeculation  Retroflexion shows intravesical prostate tissue emanating from the anterior prostate   Post-Procedure: - Patient tolerated the procedure well  Assessment/ Plan: BPH with prostatic inflammation No urethral stricture or bladder neck contracture Intravesical prostatic tissue.  Patient has no bothersome lower urinary tract symptoms F/U 6 months or prn recurrent sxs   Abbie Sons, MD

## 2020-09-07 LAB — URINALYSIS, COMPLETE
Bilirubin, UA: NEGATIVE
Ketones, UA: NEGATIVE
Leukocytes,UA: NEGATIVE
Nitrite, UA: NEGATIVE
Protein,UA: NEGATIVE
Specific Gravity, UA: 1.025 (ref 1.005–1.030)
Urobilinogen, Ur: 0.2 mg/dL (ref 0.2–1.0)
pH, UA: 5.5 (ref 5.0–7.5)

## 2020-09-07 LAB — MICROSCOPIC EXAMINATION: Bacteria, UA: NONE SEEN

## 2020-09-09 ENCOUNTER — Encounter: Payer: Self-pay | Admitting: Urology

## 2020-10-22 ENCOUNTER — Ambulatory Visit: Payer: Self-pay | Admitting: Urology

## 2020-11-02 DIAGNOSIS — Z23 Encounter for immunization: Secondary | ICD-10-CM | POA: Diagnosis not present

## 2020-11-14 DIAGNOSIS — Z125 Encounter for screening for malignant neoplasm of prostate: Secondary | ICD-10-CM | POA: Diagnosis not present

## 2020-11-14 DIAGNOSIS — R300 Dysuria: Secondary | ICD-10-CM | POA: Diagnosis not present

## 2020-11-14 DIAGNOSIS — R739 Hyperglycemia, unspecified: Secondary | ICD-10-CM | POA: Diagnosis not present

## 2020-11-14 DIAGNOSIS — Z0000 Encounter for general adult medical examination without abnormal findings: Secondary | ICD-10-CM | POA: Diagnosis not present

## 2020-11-14 DIAGNOSIS — Z1329 Encounter for screening for other suspected endocrine disorder: Secondary | ICD-10-CM | POA: Diagnosis not present

## 2020-11-14 DIAGNOSIS — Z136 Encounter for screening for cardiovascular disorders: Secondary | ICD-10-CM | POA: Diagnosis not present

## 2020-11-14 DIAGNOSIS — E119 Type 2 diabetes mellitus without complications: Secondary | ICD-10-CM | POA: Diagnosis not present

## 2020-11-14 DIAGNOSIS — Z131 Encounter for screening for diabetes mellitus: Secondary | ICD-10-CM | POA: Diagnosis not present

## 2020-11-14 DIAGNOSIS — I10 Essential (primary) hypertension: Secondary | ICD-10-CM | POA: Diagnosis not present

## 2020-11-21 DIAGNOSIS — Z0000 Encounter for general adult medical examination without abnormal findings: Secondary | ICD-10-CM | POA: Diagnosis not present

## 2020-11-21 DIAGNOSIS — E119 Type 2 diabetes mellitus without complications: Secondary | ICD-10-CM | POA: Diagnosis not present

## 2020-11-21 DIAGNOSIS — L989 Disorder of the skin and subcutaneous tissue, unspecified: Secondary | ICD-10-CM | POA: Diagnosis not present

## 2020-11-21 DIAGNOSIS — I10 Essential (primary) hypertension: Secondary | ICD-10-CM | POA: Diagnosis not present

## 2020-11-21 DIAGNOSIS — Z9889 Other specified postprocedural states: Secondary | ICD-10-CM | POA: Diagnosis not present

## 2020-11-21 DIAGNOSIS — K219 Gastro-esophageal reflux disease without esophagitis: Secondary | ICD-10-CM | POA: Diagnosis not present

## 2020-12-10 DIAGNOSIS — D1801 Hemangioma of skin and subcutaneous tissue: Secondary | ICD-10-CM | POA: Diagnosis not present

## 2020-12-10 DIAGNOSIS — L821 Other seborrheic keratosis: Secondary | ICD-10-CM | POA: Diagnosis not present

## 2020-12-13 DIAGNOSIS — H40153 Residual stage of open-angle glaucoma, bilateral: Secondary | ICD-10-CM | POA: Diagnosis not present

## 2021-01-03 DIAGNOSIS — S161XXA Strain of muscle, fascia and tendon at neck level, initial encounter: Secondary | ICD-10-CM | POA: Diagnosis not present

## 2021-03-07 ENCOUNTER — Other Ambulatory Visit: Payer: Self-pay

## 2021-03-07 ENCOUNTER — Encounter: Payer: Self-pay | Admitting: Urology

## 2021-03-07 ENCOUNTER — Ambulatory Visit: Payer: PPO | Admitting: Urology

## 2021-03-07 VITALS — BP 125/69 | HR 71 | Ht 66.0 in | Wt 170.0 lb

## 2021-03-07 DIAGNOSIS — N401 Enlarged prostate with lower urinary tract symptoms: Secondary | ICD-10-CM

## 2021-03-07 DIAGNOSIS — Z87898 Personal history of other specified conditions: Secondary | ICD-10-CM | POA: Diagnosis not present

## 2021-03-07 DIAGNOSIS — R310 Gross hematuria: Secondary | ICD-10-CM | POA: Diagnosis not present

## 2021-03-07 DIAGNOSIS — R350 Frequency of micturition: Secondary | ICD-10-CM

## 2021-03-07 NOTE — Progress Notes (Signed)
? ?03/07/2021 ?2:23 PM  ? ?[place] ?[birth year] ?[id number] ? ?Referring provider: Tracie Harrier, MD ?[place] ?[facility] ?[place],  [place] [address] ? ?No chief complaint on file. ? ?Urologic history: ?1.  BPH with lower urinary tract symptoms  ?-PVP 2005 ?-TURP 2011 at [facility] ?-TURP 07/2012 by Dr. Jacqlyn Larsen in [place] ?-Cystolitholapaxy prostatic urethral calculus, internal urethrotomy and TURP 03/2013 by Dr. Jacqlyn Larsen ? ?2.  Acute prostatitis/gross hematuria ?-07/25/2020 ?-Urine culture positive Proteus ?-CTU without upper tract abnormalities ?-Cystoscopy with TUR changes and distal adenoma regrowth, inflammatory changes proximal prostate with calcification ? ?HPI: ?71 y.o. male presents for 6 month follow-up. ? ?Doing well since last visit ?No bothersome LUTS ?Denies dysuria, gross hematuria ?Denies flank, abdominal or pelvic pain ?No longer taking tamsulosin ? ? ?PMH: ?Past Medical History:  ?Diagnosis Date  ? Diabetes mellitus without complication ([place])   ? GERD (gastroesophageal reflux disease)   ? Hypertension   ? Peptic ulcer   ? ? ?Surgical History: ?Past Surgical History:  ?Procedure Laterality Date  ? COLONOSCOPY    ? COLONOSCOPY N/A 10/24/2016  ? Procedure: COLONOSCOPY;  Surgeon: Manya Silvas, MD;  Location: [facility] ENDOSCOPY;  Service: Endoscopy;  Laterality: N/A;  ? ESOPHAGOGASTRODUODENOSCOPY    ? ESOPHAGOGASTRODUODENOSCOPY (EGD) WITH PROPOFOL N/A 10/24/2016  ? Procedure: ESOPHAGOGASTRODUODENOSCOPY (EGD) WITH PROPOFOL;  Surgeon: Manya Silvas, MD;  Location: [facility] ENDOSCOPY;  Service: Endoscopy;  Laterality: N/A;  ? PROSTATE SURGERY    ? SHOULDER ARTHROSCOPY WITH SUBACROMIAL DECOMPRESSION AND OPEN ROTATOR C Right 03/24/2019  ? Procedure: RIGHT SHOULDER ARTHROSCOPY WITH DEBRIDEMENT, DECOMPRESSION, AND ROTATOR CUFF REPAIR;  Surgeon: Corky Mull, MD;  Location: [facility] ORS;  Service: Orthopedics;  Laterality: Right;  ? ? ?Home Medications:  ?Allergies as of 03/07/2021   ?No  Known Allergies ?  ? ?  ?Medication List  ?  ? ?  ? Accurate as of March 07, 2021  2:23 PM. If you have any questions, ask your nurse or doctor.  ?  ?  ? ?  ? ?amLODipine 5 MG tablet ?Commonly known as: NORVASC ?Take 5 mg by mouth daily. ?  ?aspirin EC 81 MG tablet ?Take 81 mg by mouth daily. ?  ?latanoprost 0.005 % ophthalmic solution ?Commonly known as: XALATAN ?Place 1 drop into both eyes at bedtime. ?  ?losartan 50 MG tablet ?Commonly known as: COZAAR ?Take 50 mg by mouth daily. ?  ?omeprazole 40 MG capsule ?Commonly known as: PRILOSEC ?Take 40 mg by mouth daily. ?  ?tamsulosin 0.4 MG Caps capsule ?Commonly known as: FLOMAX ?Take 1 capsule (0.4 mg total) by mouth daily. ?  ? ?  ? ? ?Allergies: No Known Allergies ? ?Family History: ?No family history on file. ? ?Social History:  reports that he has never smoked. He has never used smokeless tobacco. He reports that he does not drink alcohol and does not use drugs. ? ? ?Physical Exam: ?BP 125/69   Pulse 71   Ht 5\' 6"  (1.676 m)   Wt 170 lb (77.1 kg)   BMI 27.44 kg/m?   ?Constitutional:  Alert and oriented, No acute distress. ?HEENT: [place] AT, moist mucus membranes.  Trachea midline, no masses. ?Respiratory: Normal respiratory effort, no increased work of breathing. ?Psychiatric: Normal mood and affect. ? ?Laboratory Data: ? ?Urinalysis ?Dipstick/microscopy negative ? ? ?Assessment & Plan:   ? ?1.  BPH with LUTS ?No bothersome voiding symptoms ?Has discontinued tamsulosin ? ?2.  History acute prostatitis/gross hematuria ?No recurrent symptoms ?UA today  clear ? ?3.  Prostate cancer screening ?Last PSA November 2021 ?PSA drawn today and if stable based on age can discontinue prostate cancer screening ? ? ?Abbie Sons, MD ? ?[place] ?[address] ?[place], [place] [address] ?[phone] ?

## 2021-03-08 LAB — MICROSCOPIC EXAMINATION
Bacteria, UA: NONE SEEN
RBC, Urine: NONE SEEN /hpf (ref 0–2)

## 2021-03-08 LAB — URINALYSIS, COMPLETE
Bilirubin, UA: NEGATIVE
Glucose, UA: NEGATIVE
Ketones, UA: NEGATIVE
Leukocytes,UA: NEGATIVE
Nitrite, UA: NEGATIVE
Protein,UA: NEGATIVE
Specific Gravity, UA: >1.03 — ABNORMAL HIGH (ref 1.005–1.030)
Urobilinogen, Ur: 0.2 mg/dL (ref 0.2–1.0)
pH, UA: 5.5 (ref 5.0–7.5)

## 2021-03-08 LAB — PSA: Prostate Specific Ag, Serum: 2.2 ng/mL (ref 0.0–4.0)

## 2021-03-11 ENCOUNTER — Telehealth: Payer: Self-pay | Admitting: *Deleted

## 2021-03-11 NOTE — Telephone Encounter (Signed)
-----   Message from Abbie Sons, MD sent at 03/10/2021 12:13 PM EST ----- ?PSA was stable at 2.2 ?

## 2021-03-11 NOTE — Telephone Encounter (Signed)
Notified patient as instructed, patient pleased °

## 2021-04-08 DIAGNOSIS — R1032 Left lower quadrant pain: Secondary | ICD-10-CM | POA: Diagnosis not present

## 2021-04-08 DIAGNOSIS — R110 Nausea: Secondary | ICD-10-CM | POA: Diagnosis not present

## 2021-04-11 DIAGNOSIS — H40153 Residual stage of open-angle glaucoma, bilateral: Secondary | ICD-10-CM | POA: Diagnosis not present

## 2021-05-10 ENCOUNTER — Other Ambulatory Visit: Payer: Self-pay | Admitting: Family Medicine

## 2021-05-10 ENCOUNTER — Ambulatory Visit
Admission: RE | Admit: 2021-05-10 | Discharge: 2021-05-10 | Disposition: A | Discharge status: Home / Self Care | Payer: PPO | Source: Ambulatory Visit | Attending: Family Medicine | Admitting: Family Medicine

## 2021-05-10 DIAGNOSIS — N5082 Scrotal pain: Secondary | ICD-10-CM | POA: Diagnosis not present

## 2021-05-10 DIAGNOSIS — N503 Cyst of epididymis: Secondary | ICD-10-CM | POA: Diagnosis not present

## 2021-05-10 DIAGNOSIS — N492 Inflammatory disorders of scrotum: Secondary | ICD-10-CM | POA: Diagnosis not present

## 2021-05-21 DIAGNOSIS — Z9889 Other specified postprocedural states: Secondary | ICD-10-CM | POA: Diagnosis not present

## 2021-05-21 DIAGNOSIS — Z0000 Encounter for general adult medical examination without abnormal findings: Secondary | ICD-10-CM | POA: Diagnosis not present

## 2021-05-21 DIAGNOSIS — I10 Essential (primary) hypertension: Secondary | ICD-10-CM | POA: Diagnosis not present

## 2021-05-21 DIAGNOSIS — K219 Gastro-esophageal reflux disease without esophagitis: Secondary | ICD-10-CM | POA: Diagnosis not present

## 2021-05-21 DIAGNOSIS — L989 Disorder of the skin and subcutaneous tissue, unspecified: Secondary | ICD-10-CM | POA: Diagnosis not present

## 2021-05-21 DIAGNOSIS — E119 Type 2 diabetes mellitus without complications: Secondary | ICD-10-CM | POA: Diagnosis not present

## 2021-05-28 DIAGNOSIS — N5089 Other specified disorders of the male genital organs: Secondary | ICD-10-CM | POA: Diagnosis not present

## 2021-05-28 DIAGNOSIS — Z9889 Other specified postprocedural states: Secondary | ICD-10-CM | POA: Diagnosis not present

## 2021-05-28 DIAGNOSIS — I10 Essential (primary) hypertension: Secondary | ICD-10-CM | POA: Diagnosis not present

## 2021-05-28 DIAGNOSIS — Z5320 Procedure and treatment not carried out because of patient's decision for unspecified reasons: Secondary | ICD-10-CM | POA: Diagnosis not present

## 2021-05-28 DIAGNOSIS — N401 Enlarged prostate with lower urinary tract symptoms: Secondary | ICD-10-CM | POA: Diagnosis not present

## 2021-05-28 DIAGNOSIS — E119 Type 2 diabetes mellitus without complications: Secondary | ICD-10-CM | POA: Diagnosis not present

## 2021-05-28 DIAGNOSIS — Z0000 Encounter for general adult medical examination without abnormal findings: Secondary | ICD-10-CM | POA: Diagnosis not present

## 2021-05-28 DIAGNOSIS — K219 Gastro-esophageal reflux disease without esophagitis: Secondary | ICD-10-CM | POA: Diagnosis not present

## 2021-06-05 ENCOUNTER — Encounter: Payer: Self-pay | Admitting: Urology

## 2021-06-05 ENCOUNTER — Ambulatory Visit: Payer: PPO | Admitting: Urology

## 2021-06-05 VITALS — BP 157/65 | HR 65 | Ht 66.0 in | Wt 167.0 lb

## 2021-06-05 DIAGNOSIS — N4340 Spermatocele of epididymis, unspecified: Secondary | ICD-10-CM

## 2021-06-05 NOTE — Progress Notes (Signed)
06/05/2021 4:32 PM   [employer] [place] [birth year] [id number]  Referring provider: Tracie Harrier, MD [address] [facility] [place],   [address]  Chief Complaint  Patient presents with   Testicle Pain   Urologic history: 1.  BPH with lower urinary tract symptoms  -PVP 2005 -TURP 2011 at [facility] -TURP 07/2012 by Dr. Jacqlyn Larsen in [place] prostatic urethral calculus, internal urethrotomy and TURP 03/2013 by Dr. Jacqlyn Larsen  2.  Acute prostatitis/gross hematuria -07/25/2020 -Urine culture positive Proteus -CTU without upper tract abnormalities -Cystoscopy with TUR changes and distal adenoma regrowth, inflammatory changes proximal prostate with calcification  HPI: 71 y.o. male referred for a new problem of right hemiscrotal swelling  Seen [facility] 05/10/2021 complaining of right hemiscrotal mass with tenderness to palpation Exam remarkable for a tender right hemiscrotal mass He was treated with doxycycline and tramadol with improvement in his pain though still complains of a palpable mass Scrotal ultrasound remarkable for normal-appearing testes bilaterally.  There was a 9 mm right epididymal cyst and a 3 mm left epididymal cyst  PMH: Past Medical History:  Diagnosis Date   Diabetes mellitus without complication ([place])    GERD (gastroesophageal reflux disease)    Hypertension    Peptic ulcer     Surgical History: Past Surgical History:  Procedure Laterality Date   COLONOSCOPY     COLONOSCOPY N/A 10/24/2016   Procedure: COLONOSCOPY;  Surgeon: Manya Silvas, MD;  Location: [facility] ENDOSCOPY;  Service: Endoscopy;  Laterality: N/A;   ESOPHAGOGASTRODUODENOSCOPY     ESOPHAGOGASTRODUODENOSCOPY (EGD) WITH PROPOFOL N/A 10/24/2016   Procedure: ESOPHAGOGASTRODUODENOSCOPY (EGD) WITH PROPOFOL;  Surgeon: Manya Silvas, MD;  Location: [facility] ENDOSCOPY;  Service: Endoscopy;  Laterality: N/A;   PROSTATE SURGERY     SHOULDER  ARTHROSCOPY WITH SUBACROMIAL DECOMPRESSION AND OPEN ROTATOR C Right 03/24/2019   Procedure: RIGHT SHOULDER ARTHROSCOPY WITH DEBRIDEMENT, DECOMPRESSION, AND ROTATOR CUFF REPAIR;  Surgeon: Corky Mull, MD;  Location: [facility] ORS;  Service: Orthopedics;  Laterality: Right;    Home Medications:  Allergies as of 06/05/2021   No Known Allergies      Medication List        Accurate as of Jun 05, 2021  4:32 PM. If you have any questions, ask your nurse or doctor.          amLODipine 5 MG tablet Commonly known as: NORVASC Take 5 mg by mouth daily.   aspirin EC 81 MG tablet Take 81 mg by mouth daily.   latanoprost 0.005 % ophthalmic solution Commonly known as: XALATAN Place 1 drop into both eyes at bedtime.   losartan 50 MG tablet Commonly known as: COZAAR Take 50 mg by mouth daily.   omeprazole 40 MG capsule Commonly known as: PRILOSEC Take 40 mg by mouth daily.   tamsulosin 0.4 MG Caps capsule Commonly known as: FLOMAX Take 1 capsule (0.4 mg total) by mouth daily.        Allergies: No Known Allergies  Family History: No family history on file.  Social History:  reports that he has never smoked. He has never used smokeless tobacco. He reports that he does not drink alcohol and does not use drugs.   Physical Exam: BP (!) 157/65   Pulse 65   Ht '5\' 6"'$  (1.676 m)   Wt 167 lb (75.8 kg)   BMI 26.95 kg/m   Constitutional:  Alert and oriented, No acute distress. HEENT:  AT, moist mucus membranes.  Trachea midline, no masses. Respiratory:  Normal respiratory effort, no increased work of breathing. GU: Phallus without lesions.  Testes descended bilaterally out masses or tenderness.  On the inferior aspect of the right hemiscrotum is a 9 mm smooth, mobile mass consistent with ultrasound findings Psychiatric: Normal mood and affect.   Assessment & Plan:    1.  Right spermatocele Pain is significantly improved. Discussed unlikely the spermatocele would resolve  spontaneously unless it ruptured Since his pain is improved no treatment is needed Spermatocelectomy was discussed if he desires excision He has elected observation and will call should he have increased pain or desires excision Spermatocelectomy was discussed including potential risks of bleeding/hematoma, infection/abscess.  All questions were answered   Abbie Sons, MD  [place] [address], [place], [place] [address] [phone]

## 2021-08-22 DIAGNOSIS — H40153 Residual stage of open-angle glaucoma, bilateral: Secondary | ICD-10-CM | POA: Diagnosis not present

## 2021-10-04 ENCOUNTER — Encounter: Payer: Self-pay | Admitting: Urology

## 2021-11-04 IMAGING — MR MR BRAIN/IAC WO/W CM
10 of 14 series · 26 of 48 positions shown · IV contrast (gadavist)
Comparison: No pertinent prior exams available for comparison.

CLINICAL DATA: Sensorineural hearing loss of right ear with
unrestricted hearing of left ear. Additional history provided by
scanning technologist: Patient reports right ear hearing loss for
the past few months, ringing in ears for the past several years.

EXAM:
MRI HEAD WITHOUT AND WITH CONTRAST
TECHNIQUE: Multiplanar, multiecho pulse sequences of the brain and surrounding
structures were obtained without and with intravenous contrast.
CONTRAST:  7mL GADAVIST GADOBUTROL 1 MMOL/ML IV SOLN

[Series 5: T1 · sagittal · 5.0mm · 0.62mm/px · 1 of 21 slices shown (1 of 3)]
[im 1/21]
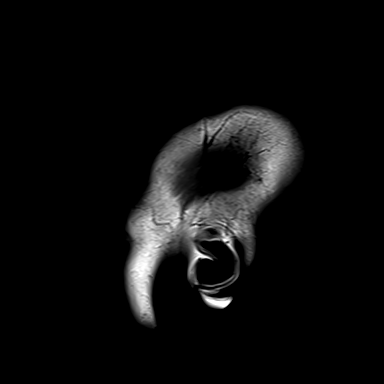

[Series 6: ax dwi_tracew · axial · 3.0mm · 0.60mm/px · z∈[-100,+55]mm · 4 of 48 slices shown]
[im 1/48]
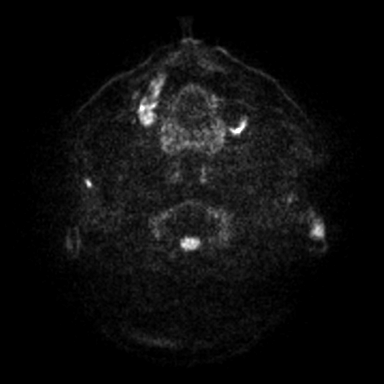
[im 16/48]
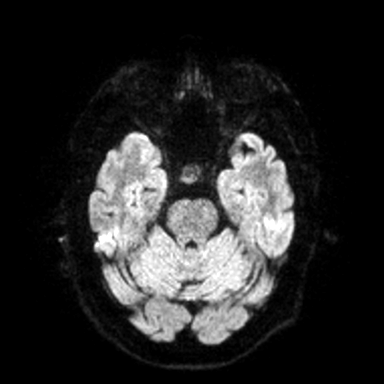
[im 32/48]
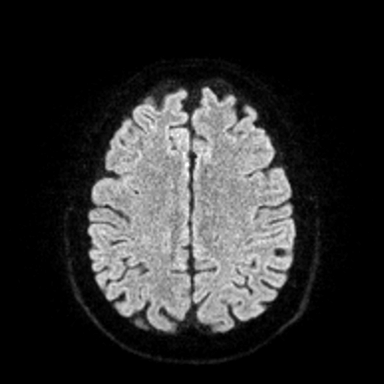
[im 48/48]
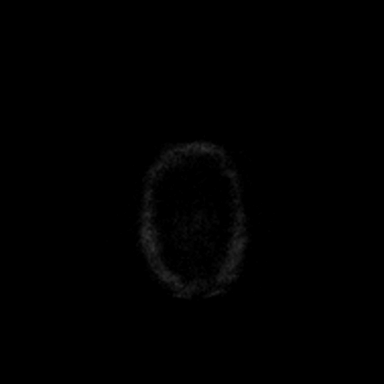

[Series 7: ax dwi_adc · axial · 3.0mm · 0.60mm/px · z∈[-100,-51]mm · 2 of 48 slices shown]
[im 1/48]
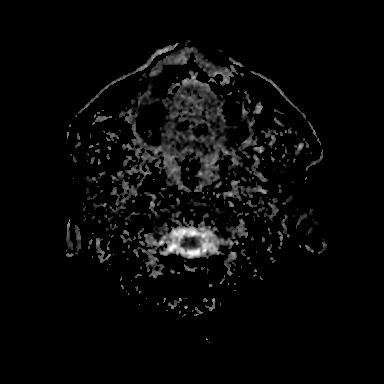
[im 16/48]
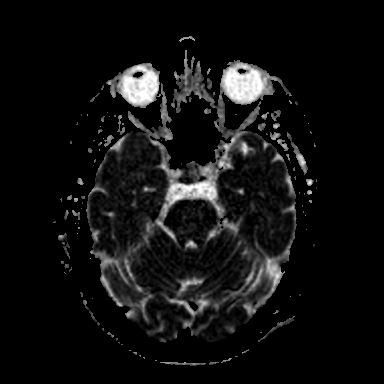

[Series 8: T2 · axial · 5.0mm · 0.53mm/px · z∈[-94,+50]mm · 2 of 25 slices shown]
[im 1/25]
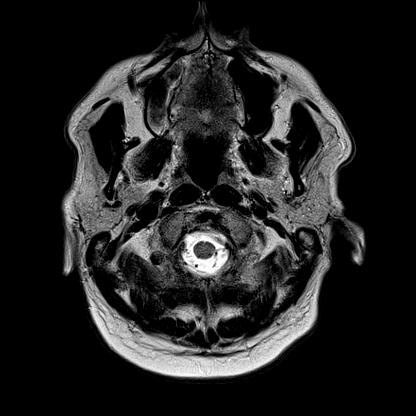
[im 25/25]
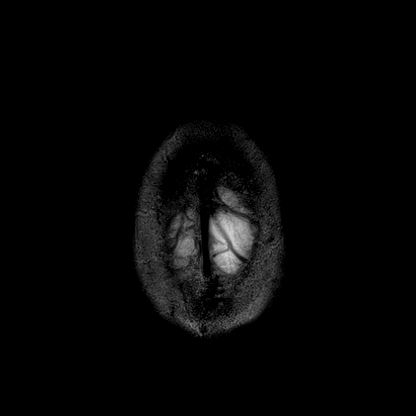

[Series 13: T1 · coronal · non-contrast · 3.0mm · 0.21mm/px · 1 of 11 slices shown (2 of 3)]
[im 1/11]
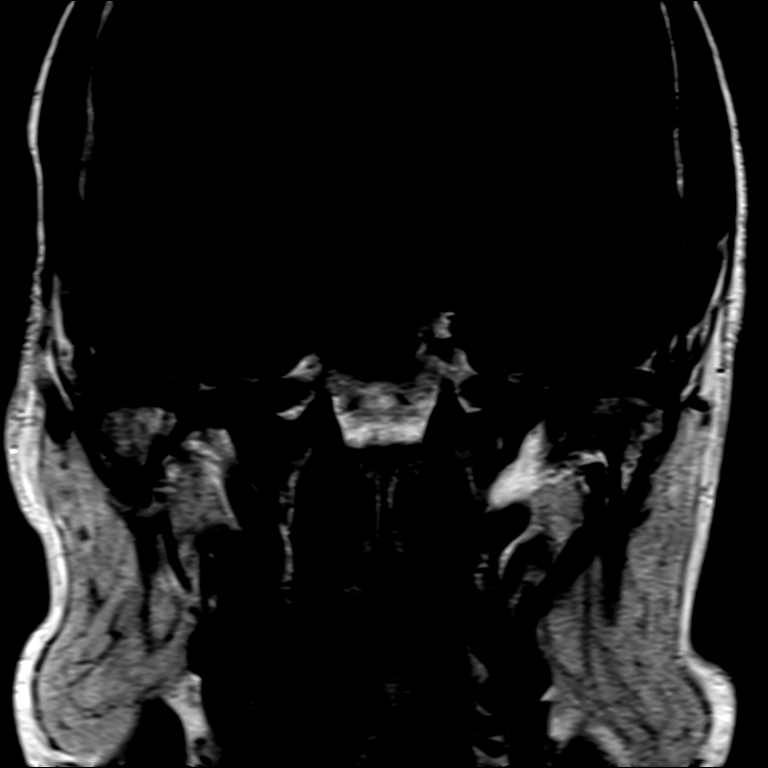

[Series 14: FLAIR · axial · 3.0mm · 0.53mm/px · z∈[-103,+59]mm · 4 of 55 slices shown]
[im 1/55]
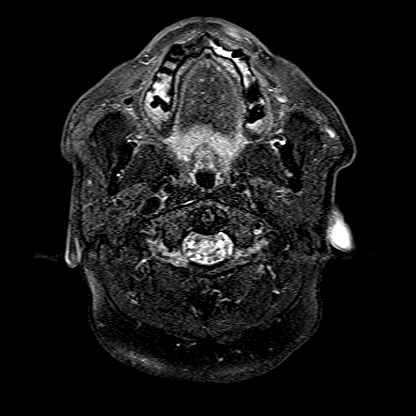
[im 19/55]
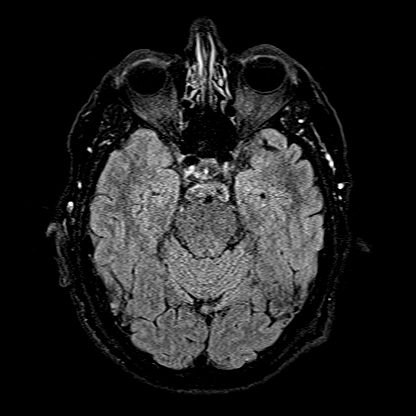
[im 37/55]
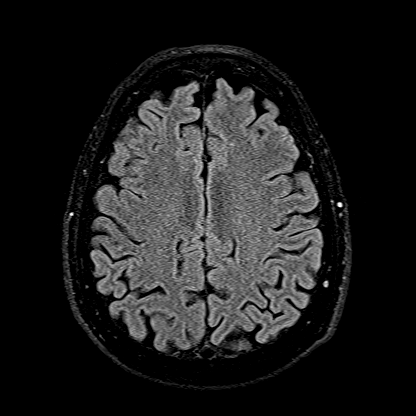
[im 55/55]
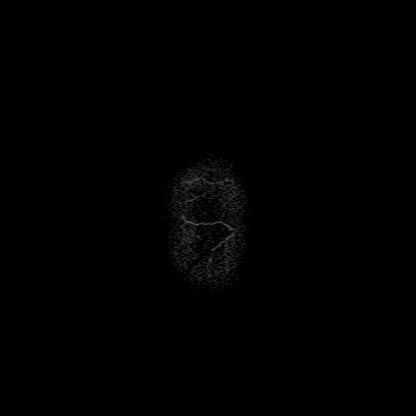

[Series 16: T1 · axial · non-contrast · 3.0mm · 0.21mm/px · 1 of 11 slices shown (3 of 3)]
[im 1/11]
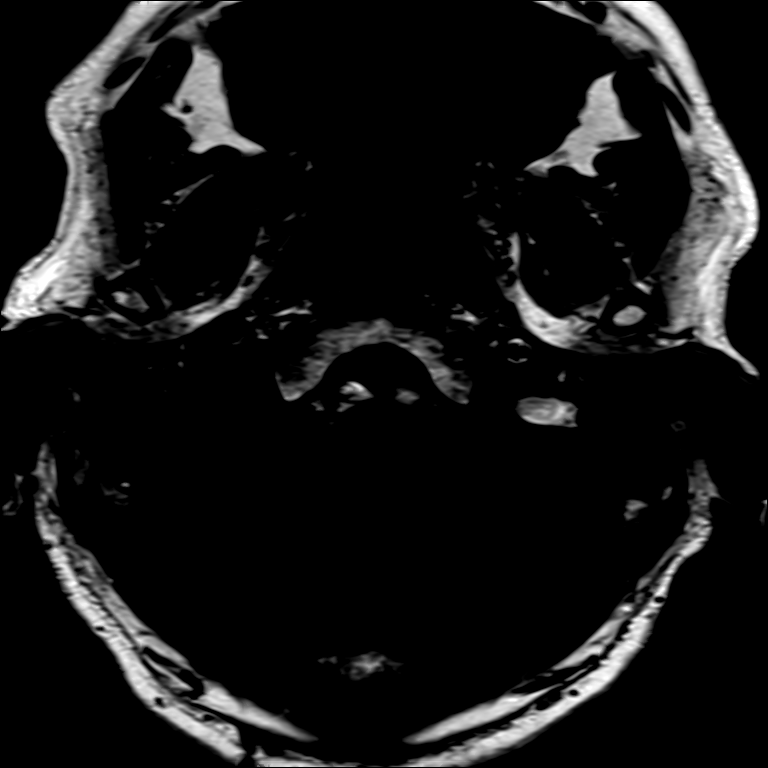

[Series 17: T1 post-contrast · axial · 3.0mm · 0.21mm/px · 1 of 11 slices shown (1 of 3)]
[im 1/11]
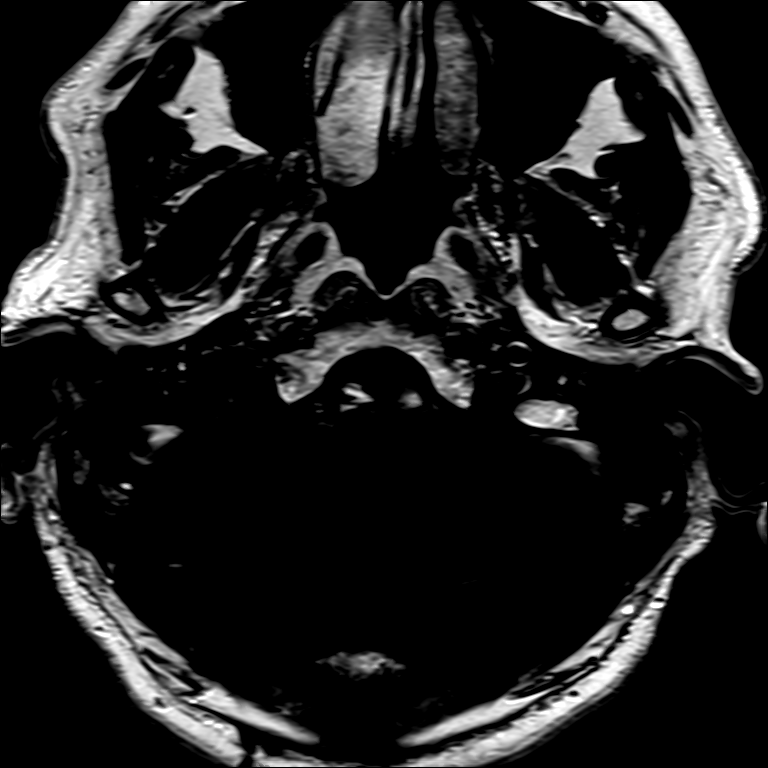

[Series 18: T1 post-contrast · coronal · 3.0mm · 0.21mm/px · 1 of 11 slices shown (2 of 3)]
[im 1/11]
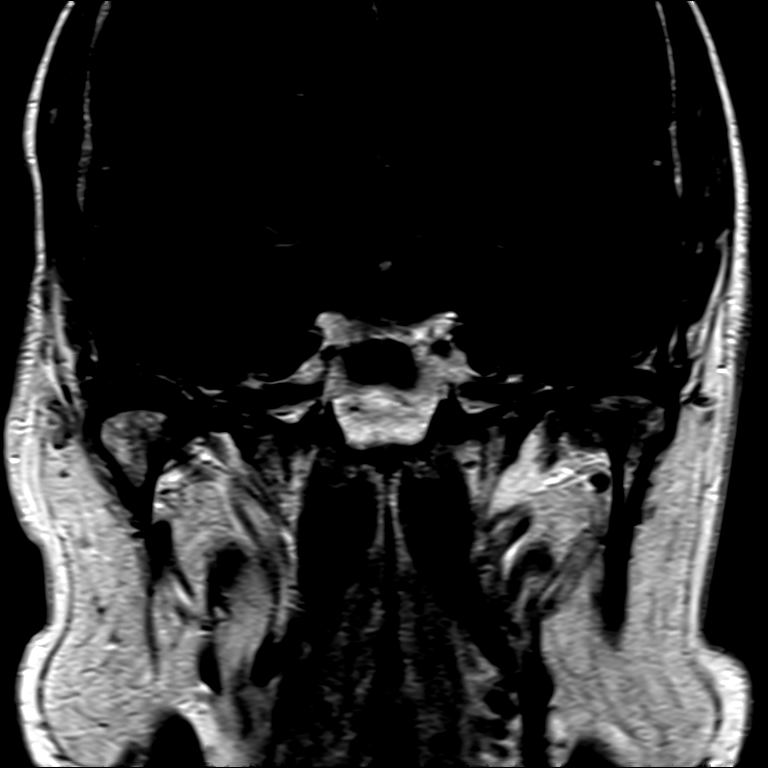

[Series 19: T1 post-contrast · axial · 1.0mm · 0.98mm/px · z∈[-110,+65]mm · 9 of 176 slices shown (3 of 3)]
[im 1/176]
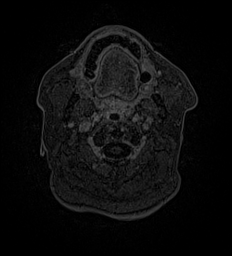
[im 30/176]
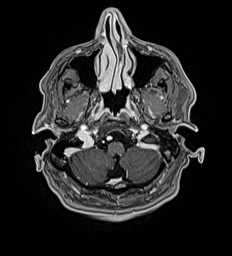
[im 59/176]
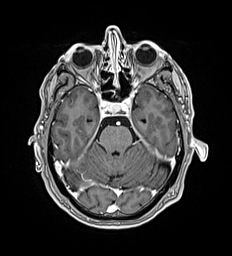
[im 73/176]
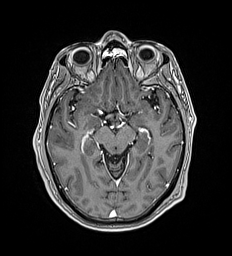
[im 88/176]
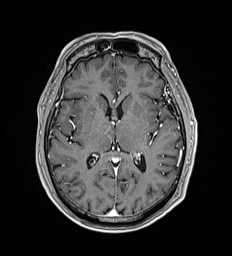
[im 103/176]
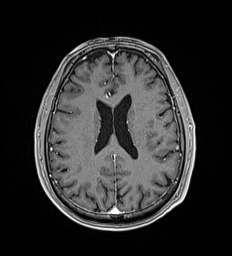
[im 117/176]
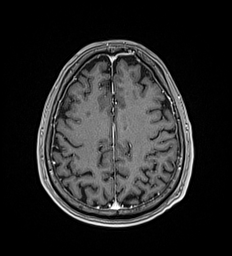
[im 146/176]
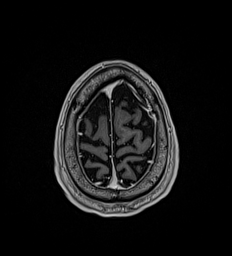
[im 176/176]
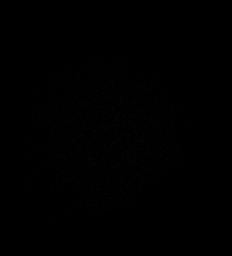

[26 of 48 positions shown; findings below may reference images not displayed]

FINDINGS: Brain:

Cerebral volume is normal.

No significant white matter disease for age.

No evidence of intracranial mass. Specifically, no cerebellopontine
angle or internal auditory canal mass is demonstrated. Diminutive
appearance of the right eighth and possibly seventh cranial nerves
as compared to the left.

There is no acute infarct.

No chronic intracranial blood products.

No extra-axial fluid collection.

No midline shift.

No abnormal intracranial enhancement.

Vascular: Normal proximal arterial flow voids. Expected vascular
enhancement

Skull and upper cervical spine: No focal marrow lesion.

Sinuses/Orbits: Visualized orbits show no acute finding. Mild
ethmoid sinus mucosal thickening. Trace fluid within the left
mastoid air cells.
IMPRESSION: 1. Diminutive appearance of the right eighth, and possibly seventh,
cranial nerves as compared to the left.
2. No cerebellopontine angle or internal auditory canal mass is
demonstrated.
3. Otherwise unremarkable MRI appearance of the brain for age.
4. No evidence of acute intracranial abnormality.
5. Mild ethmoid sinus mucosal thickening.
6. Trace fluid within the left mastoid air cells.

## 2021-11-25 DIAGNOSIS — N401 Enlarged prostate with lower urinary tract symptoms: Secondary | ICD-10-CM | POA: Diagnosis not present

## 2021-11-25 DIAGNOSIS — Z5320 Procedure and treatment not carried out because of patient's decision for unspecified reasons: Secondary | ICD-10-CM | POA: Diagnosis not present

## 2021-11-25 DIAGNOSIS — I10 Essential (primary) hypertension: Secondary | ICD-10-CM | POA: Diagnosis not present

## 2021-11-25 DIAGNOSIS — Z9889 Other specified postprocedural states: Secondary | ICD-10-CM | POA: Diagnosis not present

## 2021-11-25 DIAGNOSIS — N5089 Other specified disorders of the male genital organs: Secondary | ICD-10-CM | POA: Diagnosis not present

## 2021-11-25 DIAGNOSIS — E119 Type 2 diabetes mellitus without complications: Secondary | ICD-10-CM | POA: Diagnosis not present

## 2021-11-25 DIAGNOSIS — K219 Gastro-esophageal reflux disease without esophagitis: Secondary | ICD-10-CM | POA: Diagnosis not present

## 2021-11-25 DIAGNOSIS — Z0000 Encounter for general adult medical examination without abnormal findings: Secondary | ICD-10-CM | POA: Diagnosis not present

## 2021-11-25 DIAGNOSIS — Z1331 Encounter for screening for depression: Secondary | ICD-10-CM | POA: Diagnosis not present

## 2021-12-16 DIAGNOSIS — E113393 Type 2 diabetes mellitus with moderate nonproliferative diabetic retinopathy without macular edema, bilateral: Secondary | ICD-10-CM | POA: Diagnosis not present

## 2022-03-12 ENCOUNTER — Ambulatory Visit: Payer: PPO | Admitting: Urology

## 2022-03-21 ENCOUNTER — Ambulatory Visit: Payer: PPO | Admitting: Urology

## 2022-05-20 DIAGNOSIS — Z125 Encounter for screening for malignant neoplasm of prostate: Secondary | ICD-10-CM | POA: Diagnosis not present

## 2022-05-20 DIAGNOSIS — Z0000 Encounter for general adult medical examination without abnormal findings: Secondary | ICD-10-CM | POA: Diagnosis not present

## 2022-05-20 DIAGNOSIS — H40153 Residual stage of open-angle glaucoma, bilateral: Secondary | ICD-10-CM | POA: Diagnosis not present

## 2022-05-20 DIAGNOSIS — I10 Essential (primary) hypertension: Secondary | ICD-10-CM | POA: Diagnosis not present

## 2022-05-20 DIAGNOSIS — E119 Type 2 diabetes mellitus without complications: Secondary | ICD-10-CM | POA: Diagnosis not present

## 2022-05-20 DIAGNOSIS — N401 Enlarged prostate with lower urinary tract symptoms: Secondary | ICD-10-CM | POA: Diagnosis not present

## 2022-05-20 DIAGNOSIS — Z9889 Other specified postprocedural states: Secondary | ICD-10-CM | POA: Diagnosis not present

## 2022-05-27 DIAGNOSIS — J3081 Allergic rhinitis due to animal (cat) (dog) hair and dander: Secondary | ICD-10-CM | POA: Diagnosis not present

## 2022-05-27 DIAGNOSIS — Z0000 Encounter for general adult medical examination without abnormal findings: Secondary | ICD-10-CM | POA: Diagnosis not present

## 2022-05-27 DIAGNOSIS — R7989 Other specified abnormal findings of blood chemistry: Secondary | ICD-10-CM | POA: Diagnosis not present

## 2022-05-27 DIAGNOSIS — E1165 Type 2 diabetes mellitus with hyperglycemia: Secondary | ICD-10-CM | POA: Diagnosis not present

## 2022-05-27 DIAGNOSIS — R1031 Right lower quadrant pain: Secondary | ICD-10-CM | POA: Diagnosis not present

## 2022-05-27 DIAGNOSIS — I10 Essential (primary) hypertension: Secondary | ICD-10-CM | POA: Diagnosis not present

## 2022-05-27 DIAGNOSIS — Z9889 Other specified postprocedural states: Secondary | ICD-10-CM | POA: Diagnosis not present

## 2022-05-27 DIAGNOSIS — R1032 Left lower quadrant pain: Secondary | ICD-10-CM | POA: Diagnosis not present

## 2022-06-08 DIAGNOSIS — N390 Urinary tract infection, site not specified: Secondary | ICD-10-CM | POA: Diagnosis not present

## 2022-06-08 DIAGNOSIS — R319 Hematuria, unspecified: Secondary | ICD-10-CM | POA: Diagnosis not present

## 2022-06-08 DIAGNOSIS — Z8639 Personal history of other endocrine, nutritional and metabolic disease: Secondary | ICD-10-CM | POA: Diagnosis not present

## 2022-06-08 DIAGNOSIS — R300 Dysuria: Secondary | ICD-10-CM | POA: Diagnosis not present

## 2022-06-16 ENCOUNTER — Ambulatory Visit: Payer: PPO | Admitting: Urology

## 2022-06-16 ENCOUNTER — Encounter: Payer: Self-pay | Admitting: Urology

## 2022-06-16 VITALS — BP 154/80 | HR 78 | Ht 66.0 in | Wt 169.0 lb

## 2022-06-16 DIAGNOSIS — R310 Gross hematuria: Secondary | ICD-10-CM

## 2022-06-16 LAB — URINALYSIS, COMPLETE
Bilirubin, UA: NEGATIVE
Leukocytes,UA: NEGATIVE
Nitrite, UA: NEGATIVE
Specific Gravity, UA: 1.025 (ref 1.005–1.030)
Urobilinogen, Ur: 0.2 mg/dL (ref 0.2–1.0)
pH, UA: 5.5 (ref 5.0–7.5)

## 2022-06-16 LAB — MICROSCOPIC EXAMINATION: RBC, Urine: >30 /hpf — AB (ref 0–2)

## 2022-06-16 NOTE — Progress Notes (Signed)
I, Maysun L Gibbs,acting as a scribe for Terry Altes, MD.,have documented all relevant documentation on the behalf of Terry Altes, MD,as directed by  Terry Altes, MD while in the presence of Terry Altes, MD.  06/16/2022 10:26 AM   Terry Long November 21, 1950 [id number]  Referring provider: Barbette Reichmann, MD [address] [facility] [place],  [place] [address]  Chief Complaint  Patient presents with   Hematuria   Urologic history: 1.  BPH with lower urinary tract symptoms  PVP 2005 TURP 2011 at [facility] TURP 07/2012 by Dr. Achilles Dunk in [place] Cystolitholapaxy prostatic urethral calculus, internal urethrotomy and TURP 03/2013 by Dr. Achilles Dunk   2.  Acute prostatitis/gross hematuria 07/25/2020 Urine culture positive Proteus CTU without upper tract abnormalities Cystoscopy with TUR changes and distal adenoma regrowth, inflammatory changes proximal prostate with calcification  HPI: Terry Long is a 72 y.o. male presents for annual follow-up.  At last year's visit he was complaining of scrotal pain and had a right spermatocele, though his pain had resolved and he elected observation. On 06/06/22 he had onset of right groin pain and the following day had worsening frequency, urgency, dysuria, and gross hematuria with clots. Seen at [facility] and urinalysis showed >50 RBCs and no significant WBCs.  He was treated with Cipro for a UTI, however urine culture was negative. He had recurrent gross hematuria last week. Saw no improvement in symptoms after completing the Cipro course.   PMH: Past Medical History:  Diagnosis Date   Diabetes mellitus without complication ([facility])    GERD (gastroesophageal reflux disease)    Hypertension    Peptic ulcer     Surgical History: Past Surgical History:  Procedure Laterality Date   COLONOSCOPY     COLONOSCOPY N/A 10/24/2016   Procedure: COLONOSCOPY;  Surgeon: Scot Jun, MD;  Location: [facility] ENDOSCOPY;  Service: Endoscopy;  Laterality: N/A;   ESOPHAGOGASTRODUODENOSCOPY     ESOPHAGOGASTRODUODENOSCOPY (EGD) WITH PROPOFOL N/A 10/24/2016   Procedure: ESOPHAGOGASTRODUODENOSCOPY (EGD) WITH PROPOFOL;  Surgeon: Scot Jun, MD;  Location: [facility] ENDOSCOPY;  Service: Endoscopy;  Laterality: N/A;   PROSTATE SURGERY     SHOULDER ARTHROSCOPY WITH SUBACROMIAL DECOMPRESSION AND OPEN ROTATOR C Right 03/24/2019   Procedure: RIGHT SHOULDER ARTHROSCOPY WITH DEBRIDEMENT, DECOMPRESSION, AND ROTATOR CUFF REPAIR;  Surgeon: Christena Flake, MD;  Location: [facility] ORS;  Service: Orthopedics;  Laterality: Right;    Home Medications:  Allergies as of 06/16/2022   No Known Allergies      Medication List        Accurate as of June 16, 2022 10:26 AM. If you have any questions, ask your nurse or doctor.          amLODipine 5 MG tablet Commonly known as: NORVASC Take 5 mg by mouth daily.   aspirin EC 81 MG tablet Take 81 mg by mouth daily.   latanoprost 0.005 % ophthalmic solution Commonly known as: XALATAN Place 1 drop into both eyes at bedtime.   levothyroxine 25 MCG tablet Commonly known as: SYNTHROID Take by mouth.   losartan 50 MG tablet Commonly known as: COZAAR Take 50 mg by mouth daily.   metFORMIN 500 MG tablet Commonly known as: GLUCOPHAGE Take by mouth.   omeprazole 40 MG capsule Commonly known as: PRILOSEC Take 40 mg by mouth daily.   tamsulosin 0.4 MG Caps capsule Commonly known as: FLOMAX Take 1 capsule (0.4 mg total) by mouth daily.  Allergies: No Known Allergies   Social History:  reports that he has never smoked. He has never used smokeless tobacco. He reports that he does not drink alcohol and does not use drugs.   Physical Exam: BP (!) 154/80   Pulse 78   Ht 5\' 6"  (1.676 m)   Wt 169 lb (76.7 kg)   BMI 27.28 kg/m   Constitutional:  Alert and oriented, No acute distress. HEENT: [place] AT Respiratory: Normal respiratory  effort, no increased work of breathing. GU: Phallus without lesions. Testes descended bilaterally without masses or tenderness. Psychiatric: Normal mood and affect.   Urinalysis Dipstick 2+ glucose/3+ blood/2+ protein Microscopy >30 RBC  Assessment & Plan:    1. Gross hematuria  AUA risk stratification: High We discussed the recommended evaluation of high risk hematuria which consist of CT urogram and cystoscopy.  The procedures were discussed in detail and he has elected to proceed with further evaluation All questions were answered CTU order placed and cystoscopy was scheduled   I have reviewed the above documentation for accuracy and completeness, and I agree with the above.   Terry Altes, MD  [facility] [address] [place], [place] [address] [phone]

## 2022-06-23 ENCOUNTER — Ambulatory Visit
Admission: RE | Admit: 2022-06-23 | Discharge: 2022-06-23 | Disposition: A | Discharge status: Home / Self Care | Payer: PPO | Source: Ambulatory Visit | Attending: Urology | Admitting: Urology

## 2022-06-23 DIAGNOSIS — R310 Gross hematuria: Secondary | ICD-10-CM | POA: Diagnosis not present

## 2022-06-23 DIAGNOSIS — R319 Hematuria, unspecified: Secondary | ICD-10-CM | POA: Diagnosis not present

## 2022-06-23 MED ORDER — IOHEXOL 300 MG/ML  SOLN
125.0000 mL | Freq: Once | INTRAMUSCULAR | Status: AC | PRN
  Administered 2022-06-23: 125 mL via INTRAVENOUS

## 2022-06-23 MED ORDER — SODIUM CHLORIDE 0.9 % IV BOLUS
250.0000 mL | Freq: Once | INTRAVENOUS | Status: AC
  Administered 2022-06-23: 250 mL via INTRAVENOUS

## 2022-07-17 ENCOUNTER — Ambulatory Visit: Payer: PPO | Admitting: Urology

## 2022-07-17 ENCOUNTER — Encounter: Payer: Self-pay | Admitting: Urology

## 2022-07-17 VITALS — BP 160/68 | HR 58 | Ht 66.0 in | Wt 170.0 lb

## 2022-07-17 DIAGNOSIS — R319 Hematuria, unspecified: Secondary | ICD-10-CM | POA: Diagnosis not present

## 2022-07-17 DIAGNOSIS — R310 Gross hematuria: Secondary | ICD-10-CM | POA: Diagnosis not present

## 2022-07-17 DIAGNOSIS — N400 Enlarged prostate without lower urinary tract symptoms: Secondary | ICD-10-CM

## 2022-07-17 LAB — MICROSCOPIC EXAMINATION

## 2022-07-17 LAB — URINALYSIS, COMPLETE
Bilirubin, UA: NEGATIVE
Ketones, UA: NEGATIVE
Nitrite, UA: NEGATIVE
Specific Gravity, UA: >1.03 — ABNORMAL HIGH (ref 1.005–1.030)
Urobilinogen, Ur: 0.2 mg/dL (ref 0.2–1.0)
pH, UA: 5 (ref 5.0–7.5)

## 2022-07-17 MED ORDER — FINASTERIDE 5 MG PO TABS: 5.0000 mg | ORAL_TABLET | Freq: Every day | ORAL | 6 refills | Status: DC

## 2022-07-17 NOTE — Progress Notes (Signed)
   07/17/22  CC:  Chief Complaint  Patient presents with   Cysto    HPI: Refer to my previous note 06/16/2022.  CTU performed 06/23/2022 showed simple renal cyst x 2 on the right kidney and bladder wall thickening.  Blood pressure (!) 160/68, pulse (!) 58, height 5\' 6"  (1.676 m), weight 170 lb (77.1 kg). NED. A&Ox3.   No respiratory distress   Abd soft, NT, ND Normal phallus with bilateral descended testicles  Cystoscopy Procedure Note  Patient identification was confirmed, informed consent was obtained, and patient was prepped using Betadine solution.  Lidocaine jelly was administered per urethral meatus.     Pre-Procedure: - Inspection reveals a normal caliber urethral meatus.  Procedure: The flexible cystoscope was introduced without difficulty - No urethral strictures/lesions are present. -  Proximal adenoma regrowth  prostate with inflammatory changes/hypervascularity -Open bladder neck - Bilateral ureteral orifices identified - Bladder mucosa  reveals no ulcers, tumors, or lesions - No bladder stones - Moderate trabeculation  Retroflexion shows hyperemic, inflammatory adenoma proximal prostatic urethra   Post-Procedure: - Patient tolerated the procedure well  Assessment/ Plan: Gross hematuria secondary to BPH/adenoma regrowth Options discussed of medical management with finasteride or TURP He has initially elected the former and Rx finasteride sent to pharmacy Urine cytology sent Follow-up office visit 6 months   Riki Altes, MD

## 2022-09-22 DIAGNOSIS — H40153 Residual stage of open-angle glaucoma, bilateral: Secondary | ICD-10-CM | POA: Diagnosis not present

## 2022-12-24 DIAGNOSIS — R7989 Other specified abnormal findings of blood chemistry: Secondary | ICD-10-CM | POA: Diagnosis not present

## 2022-12-24 DIAGNOSIS — E1165 Type 2 diabetes mellitus with hyperglycemia: Secondary | ICD-10-CM | POA: Diagnosis not present

## 2022-12-24 DIAGNOSIS — Z0000 Encounter for general adult medical examination without abnormal findings: Secondary | ICD-10-CM | POA: Diagnosis not present

## 2022-12-24 DIAGNOSIS — I10 Essential (primary) hypertension: Secondary | ICD-10-CM | POA: Diagnosis not present

## 2022-12-24 DIAGNOSIS — J3081 Allergic rhinitis due to animal (cat) (dog) hair and dander: Secondary | ICD-10-CM | POA: Diagnosis not present

## 2022-12-24 DIAGNOSIS — Z9889 Other specified postprocedural states: Secondary | ICD-10-CM | POA: Diagnosis not present

## 2022-12-24 DIAGNOSIS — Z125 Encounter for screening for malignant neoplasm of prostate: Secondary | ICD-10-CM | POA: Diagnosis not present

## 2022-12-24 DIAGNOSIS — R1031 Right lower quadrant pain: Secondary | ICD-10-CM | POA: Diagnosis not present

## 2022-12-24 DIAGNOSIS — R1032 Left lower quadrant pain: Secondary | ICD-10-CM | POA: Diagnosis not present

## 2022-12-25 DIAGNOSIS — R8290 Unspecified abnormal findings in urine: Secondary | ICD-10-CM | POA: Diagnosis not present

## 2022-12-30 DIAGNOSIS — E1165 Type 2 diabetes mellitus with hyperglycemia: Secondary | ICD-10-CM | POA: Diagnosis not present

## 2022-12-30 DIAGNOSIS — Z8639 Personal history of other endocrine, nutritional and metabolic disease: Secondary | ICD-10-CM | POA: Diagnosis not present

## 2022-12-30 DIAGNOSIS — R498 Other voice and resonance disorders: Secondary | ICD-10-CM | POA: Diagnosis not present

## 2022-12-30 DIAGNOSIS — I10 Essential (primary) hypertension: Secondary | ICD-10-CM | POA: Diagnosis not present

## 2022-12-30 DIAGNOSIS — Z0000 Encounter for general adult medical examination without abnormal findings: Secondary | ICD-10-CM | POA: Diagnosis not present

## 2022-12-30 DIAGNOSIS — Z1331 Encounter for screening for depression: Secondary | ICD-10-CM | POA: Diagnosis not present

## 2022-12-30 DIAGNOSIS — K219 Gastro-esophageal reflux disease without esophagitis: Secondary | ICD-10-CM | POA: Diagnosis not present

## 2022-12-30 DIAGNOSIS — E119 Type 2 diabetes mellitus without complications: Secondary | ICD-10-CM | POA: Diagnosis not present

## 2023-01-04 DIAGNOSIS — E119 Type 2 diabetes mellitus without complications: Secondary | ICD-10-CM | POA: Diagnosis not present

## 2023-01-04 DIAGNOSIS — N390 Urinary tract infection, site not specified: Secondary | ICD-10-CM | POA: Diagnosis not present

## 2023-01-04 DIAGNOSIS — R300 Dysuria: Secondary | ICD-10-CM | POA: Diagnosis not present

## 2023-01-04 NOTE — Progress Notes (Signed)
 Terry Long is a 72 y.o. male here for established patient visit to discuss:  History of Present Illness:   1. UTI?: 72 y.o. male, comes in today with reports of dysuria, hematuria, increased urinary frequency and urgency starting yesterday 01/03/2023. History of diabetes mellitus type 2 with last A1c as below. No documented history of chronic renal insufficiency (last creatinine as below reviewed in anticipation of potential medical management).  Urinalysis from 12/24/2022 with PCP consistent with acute UTI, urine culture from 12/25/2022 with vancomycin-resistant Enterococcus, patient had amoxicillin  sent to his pharmacy [facility]) by his primary care provider however patient is unaware of this and has not picked up any medications from the pharmacy recently and as such has not taken any antibiotics for signs. No testicular or penile trauma. Denies fever, chills, nausea, vomiting, diarrhea, abdominal pain, penile discharge, testicular pain, rectal pain, pyuria, close with some angle tenderness. No history of kidney stones. No history of renal surgery.  History of prostate surgery.  Lab Results  Component Value Date   HGBA1C 6.4 (H) 12/24/2022   HGBA1C 6.5 (H) 05/20/2022     Lab Results  Component Value Date   CREATININE 0.9 12/24/2022      The following portions of the patient's history were reviewed and updated as appropriate.   Past Medical History:   Past Medical History:  Diagnosis Date  . Diabetes mellitus without complication (CMS/HHS-HCC)   . Hypertension   . Peptic ulcer   . Thyroid  disease      Past Surgical History:   Past Surgical History:  Procedure Laterality Date  . COLONOSCOPY  10/24/2016   Int Hemorrhoids, Diverticulosis: CBF 10/2026  . EGD  10/24/2016   Gastritis: No repeat per RTE  . Extensive arthroscopic debridement, arthroscopic subacromial decompression, and mini-open rotator cuff repair using [employer] Regeneten patch, right shoulder.  Right 03/24/2019   Dr. Edie  . Prostate surgery     Has had 4 prostate surgeries since 2005.  SABRA UPPER GASTROINTESTINAL ENDOSCOPY       Allergies:   No Known Allergies   Medications:   Prior to Admission medications   Medication Sig Taking? Last Dose  amLODIPine (NORVASC) 2.5 MG tablet Take 1 tablet (2.5 mg total) by mouth once daily Yes Taking  amLODIPine (NORVASC) 5 MG tablet Take 1 tablet by mouth once daily Yes Taking  amoxicillin  (AMOXIL ) 875 MG tablet Take 1 tablet (875 mg total) by mouth 2 (two) times daily for 7 days Yes Taking  aspirin 81 MG chewable tablet Take 81 mg by mouth once daily    Yes Taking  finasteride  (PROSCAR ) 5 mg tablet Take 5 mg by mouth once daily Yes Taking  fluticasone propionate (FLONASE) 50 mcg/actuation nasal spray Place 1 spray into both nostrils 2 (two) times daily Yes Taking  latanoprost (XALATAN) 0.005 % ophthalmic solution INSTILL 1 DROP INTO EACH EYE AT BEDTIME   Yes Taking  levothyroxine (SYNTHROID) 25 MCG tablet Take 1 tablet (25 mcg total) by mouth once daily Take on an empty stomach with a glass of water at least 30-60 minutes before breakfast. Yes Taking  losartan (COZAAR) 50 MG tablet Take 1 tablet by mouth once daily Yes Taking  metFORMIN  (GLUCOPHAGE ) 500 MG tablet TAKE 1 TABLET BY MOUTH TWICE DAILY WITH MEALS Yes Taking  omeprazole (PRILOSEC) 40 MG DR capsule Take 1 capsule by mouth once daily Yes Taking  blood glucose meter kit by XX route 2 (two) times daily Patient taking differently: 1 each every  Wednesday    blood sugar diagnostic, drum (BLOOD GLUCOSE DIAGNOSTIC, DRUM) test strip Use once daily Patient taking differently: Use 1 each every 7 (seven) days    lancets Use 1 each once daily Use as instructed.    lancing device with lancets kit Use 1 each 2 (two) times daily       Family History:   Family History  Problem Relation Name Age of Onset  . Diabetes type II Sister    . Diabetes type II Brother    . Prostate cancer  Brother    . Diabetes type II Brother    . Diabetes type II Brother    . Kidney disease Brother    . Cirrhosis Sister       Social History:   Social History   Socioeconomic History  . Marital status: Married  Tobacco Use  . Smoking status: Never  . Smokeless tobacco: Never  Vaping Use  . Vaping status: Never Used  Substance and Sexual Activity  . Alcohol  use: No    Alcohol /week: 0.0 standard drinks of alcohol   . Drug use: No  . Sexual activity: Defer   Social Drivers of Health   Financial Resource Strain: Low Risk  (12/30/2022)   Overall Financial Resource Strain (CARDIA)   . Difficulty of Paying Living Expenses: Not hard at all  Food Insecurity: No Food Insecurity (12/30/2022)   Hunger Vital Sign   . Worried About Programme researcher, broadcasting/film/video in the Last Year: Never true   . Ran Out of Food in the Last Year: Never true  Transportation Needs: No Transportation Needs (12/30/2022)   PRAPARE - Transportation   . Lack of Transportation (Medical): No   . Lack of Transportation (Non-Medical): No  Housing Stability: Low Risk  (12/30/2022)   Housing Stability Vital Sign   . Unable to Pay for Housing in the Last Year: No   . Number of Times Moved in the Last Year: 0   . Homeless in the Last Year: No     Review of Systems:   As per HPI   Vital Signs:   Vitals:   01/04/23 1318  BP: (!) 158/67  Pulse: 61  Temp: 36.7 C (98 F)  SpO2: 95%  Weight: 78 kg (172 lb)  Height: 167.6 cm (5' 6)     Body mass index is 27.76 kg/m.   Physical Exam:   General:  Well appearing, pleasant, no acute distress,  Eyes:  No conjunctivitis  Mouth:  Moist mucous membranes  Lungs:  Clear to auscultation bilaterally without wheeze, rale or rhonchi. Normal work of breathing Cardiovascular:  Regular rate and rhythm without murmur or gallop Abdomen:  Soft, nontender, nondistended, normal bowel sounds Skin:  Warm and dry with good turgor Back:  No CVA tenderness Neurologic:  Alert      Labs:   Results for orders placed or performed in visit on 01/04/23  Urinalysis w/Microscopic  Result Value Ref Range   Color Yellow Colorless, Straw, Light Yellow, Yellow, Dark Yellow   Clarity Cloudy (!) Clear   Specific Gravity 1.025 1.000 - 1.030   pH, Urine 5.5 5.0 - 8.0   Protein, Urinalysis 100 (!) Negative, Trace mg/dL   Glucose, Urinalysis 749 (!) Negative mg/dL   Ketones, Urinalysis Trace (!) Negative mg/dL   Blood, Urinalysis Large (!) Negative   Nitrite, Urinalysis Positive (!) Negative   Leukocyte Esterase, Urinalysis Moderate (!) Negative   White Blood Cells, Urinalysis >50 (!) None Seen, 0-3 /hpf  Red Blood Cells, Urinalysis 10-50 (!) None Seen, 0-3 /hpf   Bacteria, Urinalysis Many (!) None Seen /hpf   Squamous Epithelial Cells, Urinalysis None Seen Rare, Few, None Seen /hpf       Assessment and Plan:   1. Dysuria  (primary encounter diagnosis) Acute UTI Recurrent UTI (urinary tract infection) Type 2 diabetes mellitus without complication, without long-term current use of insulin (CMS/HHS-HCC): Acute.  Urinalysis as above consistent with acute UTI.  History of recurrent UTI with recent vancomycin-resistant Enterococcus on culture from 12/25/2022 (patient did have amoxicillin  sent to his pharmacy by his primary care provider with regards to this however patient was unaware and has not yet started antibiotic therapy).  Reviewed resistance profile of VRE from previous culture, will proceed with amoxicillin -clavulanate as per orders.  Follow-up with PCP if not improving.  Will tailor antibiotic therapy based on culture results.  To ER if severe symptoms.  Handout given.  Red flags reviewed.    Orders for this Visit:   Diagnoses and all orders for this visit:  Acute UTI  Dysuria -     Urinalysis w/Microscopic; Future -     Urine Culture, Routine - [facility]; Future  Recurrent UTI (urinary tract infection)  Type 2 diabetes mellitus without complication, without  long-term current use of insulin (CMS/HHS-HCC)  Other orders -     amoxicillin -clavulanate (AUGMENTIN) 875-125 mg tablet; Take 1 tablet (875 mg total) by mouth every 12 (twelve) hours for 7 days     Portions of the record may have been created with voice recognition software. Occasional wrong-word or 'sound-a-like' substitutions may have occurred due to the inherent limitations of voice recognition software.  Read the chart carefully and recognize, using context, where substitutions may have occurred.

## 2023-01-13 ENCOUNTER — Other Ambulatory Visit: Payer: Self-pay | Admitting: Student

## 2023-01-13 DIAGNOSIS — R490 Dysphonia: Secondary | ICD-10-CM | POA: Diagnosis not present

## 2023-01-13 DIAGNOSIS — J3801 Paralysis of vocal cords and larynx, unilateral: Secondary | ICD-10-CM

## 2023-01-19 ENCOUNTER — Other Ambulatory Visit: Payer: Self-pay | Admitting: Urology

## 2023-01-20 DIAGNOSIS — H40153 Residual stage of open-angle glaucoma, bilateral: Secondary | ICD-10-CM | POA: Diagnosis not present

## 2023-01-22 ENCOUNTER — Ambulatory Visit: Payer: PPO | Admitting: Urology

## 2023-02-03 ENCOUNTER — Ambulatory Visit
Admission: RE | Admit: 2023-02-03 | Discharge: 2023-02-03 | Disposition: A | Discharge status: Home / Self Care | Payer: PPO | Source: Ambulatory Visit | Attending: Student

## 2023-02-03 ENCOUNTER — Ambulatory Visit
Admission: RE | Admit: 2023-02-03 | Discharge: 2023-02-03 | Disposition: A | Discharge status: Home / Self Care | Payer: PPO | Source: Ambulatory Visit | Attending: Student | Admitting: Student

## 2023-02-03 DIAGNOSIS — I10 Essential (primary) hypertension: Secondary | ICD-10-CM | POA: Diagnosis not present

## 2023-02-03 DIAGNOSIS — J3801 Paralysis of vocal cords and larynx, unilateral: Secondary | ICD-10-CM

## 2023-02-03 DIAGNOSIS — N401 Enlarged prostate with lower urinary tract symptoms: Secondary | ICD-10-CM | POA: Diagnosis not present

## 2023-02-03 MED ORDER — IOPAMIDOL (ISOVUE-300) INJECTION 61%
100.0000 mL | Freq: Once | INTRAVENOUS | Status: AC | PRN
  Administered 2023-02-03: 75 mL via INTRAVENOUS

## 2023-02-04 ENCOUNTER — Ambulatory Visit: Payer: PPO | Admitting: Urology

## 2023-02-04 ENCOUNTER — Encounter: Payer: Self-pay | Admitting: Urology

## 2023-02-04 VITALS — BP 130/70 | HR 74 | Ht 66.0 in | Wt 170.0 lb

## 2023-02-04 DIAGNOSIS — R310 Gross hematuria: Secondary | ICD-10-CM | POA: Diagnosis not present

## 2023-02-04 DIAGNOSIS — N401 Enlarged prostate with lower urinary tract symptoms: Secondary | ICD-10-CM

## 2023-02-04 LAB — URINALYSIS, COMPLETE
Bilirubin, UA: NEGATIVE
Glucose, UA: NEGATIVE
Ketones, UA: NEGATIVE
Leukocytes,UA: NEGATIVE
Nitrite, UA: NEGATIVE
RBC, UA: NEGATIVE
Specific Gravity, UA: 1.025 (ref 1.005–1.030)
Urobilinogen, Ur: 0.2 mg/dL (ref 0.2–1.0)
pH, UA: 5.5 (ref 5.0–7.5)

## 2023-02-04 LAB — MICROSCOPIC EXAMINATION

## 2023-02-04 MED ORDER — FINASTERIDE 5 MG PO TABS: 5.0000 mg | ORAL_TABLET | Freq: Every day | ORAL | 3 refills | Status: AC

## 2023-02-04 NOTE — Progress Notes (Signed)
I, Maysun Anabel Bene, acting as a scribe for Riki Altes, MD., have documented all relevant documentation on the behalf of Riki Altes, MD, as directed by Riki Altes, MD while in the presence of Riki Altes, MD.  02/04/2023 4:48 PM   [place] 17-Nov-1950 [id number]  Referring provider: Barbette Reichmann, MD [address] [place] [facility] [place],  [place] [address]  Chief Complaint  Patient presents with   Hematuria   Urologic history: 1.  BPH with lower urinary tract symptoms  PVP 2005 TURP 2011 at [facility] TURP 07/2012 by Dr. Achilles Dunk in [facility] Cystolitholapaxy prostatic urethral calculus, internal urethrotomy and TURP 03/2013 by Dr. Achilles Dunk   2.  Acute prostatitis/gross hematuria 07/25/2020 Urine culture positive [facility] CTU without upper tract abnormalities Cystoscopy with TUR changes and distal adenoma regrowth, inflammatory changes proximal prostate with calcification  HPI: Terry Long is a 73 y.o. male presents for a six-month follow-up.  Cystoscopy performed 07/17/22 for evaluation of gross hematuria remarkable for proximal adenoma regrowth with inflammatory changes in hypervascularity. No bladder mucosal lesions were identified. He elected to start finasteride, which he has been taking for 6 months. Denies recurring gross hematuria and states he is doing well.  He has no bothersome lower urinary tract symptoms.  His urine cytology was negative.    PMH: Past Medical History:  Diagnosis Date   Diabetes mellitus without complication (HCC)    GERD (gastroesophageal reflux disease)    Hypertension    Peptic ulcer     Surgical History: Past Surgical History:  Procedure Laterality Date   COLONOSCOPY     COLONOSCOPY N/A 10/24/2016   Procedure: COLONOSCOPY;  Surgeon: Scot Jun, MD;  Location: [facility] ENDOSCOPY;  Service: Endoscopy;  Laterality: N/A;   ESOPHAGOGASTRODUODENOSCOPY     ESOPHAGOGASTRODUODENOSCOPY (EGD)  WITH PROPOFOL N/A 10/24/2016   Procedure: ESOPHAGOGASTRODUODENOSCOPY (EGD) WITH PROPOFOL;  Surgeon: Scot Jun, MD;  Location: [facility] ENDOSCOPY;  Service: Endoscopy;  Laterality: N/A;   PROSTATE SURGERY     SHOULDER ARTHROSCOPY WITH SUBACROMIAL DECOMPRESSION AND OPEN ROTATOR C Right 03/24/2019   Procedure: RIGHT SHOULDER ARTHROSCOPY WITH DEBRIDEMENT, DECOMPRESSION, AND ROTATOR CUFF REPAIR;  Surgeon: Christena Flake, MD;  Location: [facility] ORS;  Service: Orthopedics;  Laterality: Right;    Home Medications:  Allergies as of 02/04/2023   No Known Allergies      Medication List        Accurate as of February 04, 2023  4:48 PM. If you have any questions, ask your nurse or doctor.          amLODipine 5 MG tablet Commonly known as: NORVASC Take 5 mg by mouth daily.   aspirin EC 81 MG tablet Take 81 mg by mouth daily.   finasteride 5 MG tablet Commonly known as: PROSCAR Take 1 tablet (5 mg total) by mouth daily.   latanoprost 0.005 % ophthalmic solution Commonly known as: XALATAN Place 1 drop into both eyes at bedtime.   levothyroxine 25 MCG tablet Commonly known as: SYNTHROID Take by mouth.   losartan 50 MG tablet Commonly known as: COZAAR Take 50 mg by mouth daily.   metFORMIN 500 MG tablet Commonly known as: GLUCOPHAGE Take by mouth.   omeprazole 40 MG capsule Commonly known as: PRILOSEC Take 40 mg by mouth daily.   tamsulosin 0.4 MG Caps capsule Commonly known as: FLOMAX Take 1 capsule (0.4 mg total) by mouth daily.        Allergies: No Known  Allergies  Social History:  reports that he has never smoked. He has never used smokeless tobacco. He reports that he does not drink alcohol and does not use drugs.   Physical Exam: BP 130/70   Pulse 74   Ht 5\' 6"  (1.676 m)   Wt 170 lb (77.1 kg)   BMI 27.44 kg/m   Constitutional:  Alert and oriented, No acute distress. HEENT: [place] AT Respiratory: Normal respiratory effort, no increased work of  breathing. Psychiatric: Normal mood and affect.   Urinalysis Dipstick 1+ protein, microscopy negative.   Assessment & Plan:    1. BPH with gross hematuria Hematuria resolved on finasteride. Refill sent to pharmacy.  Continue tamsulosin.  1 year follow-up with UA/PVR.  Instructed to call earlier for recurrent gross hematuria or any change in his voiding pattern.   I have reviewed the above documentation for accuracy and completeness, and I agree with the above.   Riki Altes, MD  [facility] [address] [place], [place] [address] [phone]

## 2023-02-10 DIAGNOSIS — J3801 Paralysis of vocal cords and larynx, unilateral: Secondary | ICD-10-CM | POA: Diagnosis not present

## 2023-02-10 DIAGNOSIS — R490 Dysphonia: Secondary | ICD-10-CM | POA: Diagnosis not present

## 2023-03-24 DIAGNOSIS — J3801 Paralysis of vocal cords and larynx, unilateral: Secondary | ICD-10-CM | POA: Diagnosis not present

## 2023-06-10 DIAGNOSIS — H40153 Residual stage of open-angle glaucoma, bilateral: Secondary | ICD-10-CM | POA: Diagnosis not present

## 2023-06-23 DIAGNOSIS — Z8639 Personal history of other endocrine, nutritional and metabolic disease: Secondary | ICD-10-CM | POA: Diagnosis not present

## 2023-06-23 DIAGNOSIS — E119 Type 2 diabetes mellitus without complications: Secondary | ICD-10-CM | POA: Diagnosis not present

## 2023-06-23 DIAGNOSIS — Z0000 Encounter for general adult medical examination without abnormal findings: Secondary | ICD-10-CM | POA: Diagnosis not present

## 2023-06-23 DIAGNOSIS — I10 Essential (primary) hypertension: Secondary | ICD-10-CM | POA: Diagnosis not present

## 2023-06-23 DIAGNOSIS — E1165 Type 2 diabetes mellitus with hyperglycemia: Secondary | ICD-10-CM | POA: Diagnosis not present

## 2023-06-23 DIAGNOSIS — K219 Gastro-esophageal reflux disease without esophagitis: Secondary | ICD-10-CM | POA: Diagnosis not present

## 2023-06-23 DIAGNOSIS — R498 Other voice and resonance disorders: Secondary | ICD-10-CM | POA: Diagnosis not present

## 2023-06-30 DIAGNOSIS — Z0000 Encounter for general adult medical examination without abnormal findings: Secondary | ICD-10-CM | POA: Diagnosis not present

## 2023-06-30 DIAGNOSIS — E1165 Type 2 diabetes mellitus with hyperglycemia: Secondary | ICD-10-CM | POA: Diagnosis not present

## 2023-06-30 DIAGNOSIS — R498 Other voice and resonance disorders: Secondary | ICD-10-CM | POA: Diagnosis not present

## 2023-06-30 DIAGNOSIS — I10 Essential (primary) hypertension: Secondary | ICD-10-CM | POA: Diagnosis not present

## 2023-06-30 DIAGNOSIS — Z1331 Encounter for screening for depression: Secondary | ICD-10-CM | POA: Diagnosis not present

## 2023-06-30 DIAGNOSIS — K219 Gastro-esophageal reflux disease without esophagitis: Secondary | ICD-10-CM | POA: Diagnosis not present

## 2023-07-13 DIAGNOSIS — M7581 Other shoulder lesions, right shoulder: Secondary | ICD-10-CM | POA: Diagnosis not present

## 2023-07-13 DIAGNOSIS — M75121 Complete rotator cuff tear or rupture of right shoulder, not specified as traumatic: Secondary | ICD-10-CM | POA: Diagnosis not present

## 2023-07-13 DIAGNOSIS — Z9889 Other specified postprocedural states: Secondary | ICD-10-CM | POA: Diagnosis not present

## 2023-07-13 DIAGNOSIS — M25511 Pain in right shoulder: Secondary | ICD-10-CM | POA: Diagnosis not present

## 2023-08-10 ENCOUNTER — Encounter: Payer: Self-pay | Admitting: Urology

## 2023-09-01 DIAGNOSIS — I10 Essential (primary) hypertension: Secondary | ICD-10-CM | POA: Diagnosis not present

## 2023-09-01 DIAGNOSIS — K5900 Constipation, unspecified: Secondary | ICD-10-CM | POA: Diagnosis not present

## 2023-09-01 DIAGNOSIS — M549 Dorsalgia, unspecified: Secondary | ICD-10-CM | POA: Diagnosis not present

## 2023-09-01 DIAGNOSIS — I452 Bifascicular block: Secondary | ICD-10-CM | POA: Diagnosis not present

## 2023-09-01 DIAGNOSIS — M546 Pain in thoracic spine: Secondary | ICD-10-CM | POA: Diagnosis not present

## 2023-09-01 DIAGNOSIS — R001 Bradycardia, unspecified: Secondary | ICD-10-CM | POA: Diagnosis not present

## 2023-09-01 DIAGNOSIS — R490 Dysphonia: Secondary | ICD-10-CM | POA: Diagnosis not present

## 2023-09-01 DIAGNOSIS — R748 Abnormal levels of other serum enzymes: Secondary | ICD-10-CM | POA: Diagnosis not present

## 2023-09-01 DIAGNOSIS — K219 Gastro-esophageal reflux disease without esophagitis: Secondary | ICD-10-CM | POA: Diagnosis not present

## 2023-09-01 DIAGNOSIS — E119 Type 2 diabetes mellitus without complications: Secondary | ICD-10-CM | POA: Diagnosis not present

## 2023-09-01 DIAGNOSIS — Z79899 Other long term (current) drug therapy: Secondary | ICD-10-CM | POA: Diagnosis not present

## 2023-09-01 DIAGNOSIS — I440 Atrioventricular block, first degree: Secondary | ICD-10-CM | POA: Diagnosis not present

## 2023-09-01 DIAGNOSIS — Z7982 Long term (current) use of aspirin: Secondary | ICD-10-CM | POA: Diagnosis not present

## 2023-09-01 DIAGNOSIS — R079 Chest pain, unspecified: Secondary | ICD-10-CM | POA: Diagnosis not present

## 2023-09-03 DIAGNOSIS — R109 Unspecified abdominal pain: Secondary | ICD-10-CM | POA: Diagnosis not present

## 2023-10-06 ENCOUNTER — Ambulatory Visit (INDEPENDENT_AMBULATORY_CARE_PROVIDER_SITE_OTHER): Admitting: Otolaryngology

## 2023-10-06 ENCOUNTER — Encounter (INDEPENDENT_AMBULATORY_CARE_PROVIDER_SITE_OTHER): Payer: Self-pay | Admitting: Otolaryngology

## 2023-10-06 VITALS — BP 175/88 | HR 76 | Temp 97.9°F

## 2023-10-06 DIAGNOSIS — J383 Other diseases of vocal cords: Secondary | ICD-10-CM

## 2023-10-06 DIAGNOSIS — J3801 Paralysis of vocal cords and larynx, unilateral: Secondary | ICD-10-CM

## 2023-10-06 DIAGNOSIS — R490 Dysphonia: Secondary | ICD-10-CM

## 2023-10-06 NOTE — Progress Notes (Signed)
 ENT CONSULT:  Reason for Consult: chronic dysphonia x 2 yrs    HPI: Discussed the use of AI scribe software for clinical note transcription with the patient, who gave verbal consent to proceed.  History of Present Illness Terry Long is a 73 year old male who presents with voice changes.  He has experienced voice changes characterized by a raspy or hoarse voice for the past two years, with worsening symptoms over the past year. No history of smoking, postnasal drainage, or trouble swallowing. He has a history of acid reflux and has been taking medication for about a month.  He underwent a hearing test about a year and a half ago and was told he had a little hearing loss. He has not had any surgeries on his head or neck area.  In January, he had a consultation where a similar diagnosis was made, and a CT scan was performed. No history of stroke or heart attack, although he was hospitalized last month due to concerns about back, hip, and lung pain, suspected to be related to esophageal issues.   Records Reviewed:  Seen by another provider 01/13/23 and 02/10/23 and was diagnosed with VF paralysis    PCP at [facility] office visit 10/06/23 Hx of T1DM Past Medical History:  Diagnosis Date   Diabetes mellitus without complication (HCC)    GERD (gastroesophageal reflux disease)    Hypertension    Peptic ulcer     Past Surgical History:  Procedure Laterality Date   COLONOSCOPY     COLONOSCOPY N/A 10/24/2016   Procedure: COLONOSCOPY;  Surgeon: Viktoria Lamar DASEN, MD;  Location: [facility] ENDOSCOPY;  Service: Endoscopy;  Laterality: N/A;   ESOPHAGOGASTRODUODENOSCOPY     ESOPHAGOGASTRODUODENOSCOPY (EGD) WITH PROPOFOL  N/A 10/24/2016   Procedure: ESOPHAGOGASTRODUODENOSCOPY (EGD) WITH PROPOFOL ;  Surgeon: Viktoria Lamar DASEN, MD;  Location: [facility] ENDOSCOPY;  Service: Endoscopy;  Laterality: N/A;   PROSTATE SURGERY     SHOULDER ARTHROSCOPY WITH SUBACROMIAL DECOMPRESSION AND OPEN ROTATOR C Right  03/24/2019   Procedure: RIGHT SHOULDER ARTHROSCOPY WITH DEBRIDEMENT, DECOMPRESSION, AND ROTATOR CUFF REPAIR;  Surgeon: Edie Norleen PARAS, MD;  Location: [facility] ORS;  Service: Orthopedics;  Laterality: Right;    No family history on file.  Social History:  reports that he has never smoked. He has never used smokeless tobacco. He reports that he does not drink alcohol  and does not use drugs.  Allergies: No Known Allergies  Medications: I have reviewed the patient's current medications.  The PMH, PSH, Medications, Allergies, and SH were reviewed and updated.  ROS: Constitutional: Negative for fever, weight loss and weight gain. Cardiovascular: Negative for chest pain and dyspnea on exertion. Respiratory: Is not experiencing shortness of breath at rest. Gastrointestinal: Negative for nausea and vomiting. Neurological: Negative for headaches. Psychiatric: The patient is not nervous/anxious  Blood pressure (!) 175/88, pulse 76, temperature 97.9 F (36.6 C), SpO2 94%. There is no height or weight on file to calculate BMI.  PHYSICAL EXAM:  Exam: General: Well-developed, well-nourished Communication and Voice: Clear pitch and clarity Respiratory Respiratory effort: Equal inspiration and expiration without stridor Cardiovascular Peripheral Vascular: Warm extremities with equal color/perfusion Eyes: No nystagmus with equal extraocular motion bilaterally Neuro/Psych/Balance: Patient oriented to person, place, and time; Appropriate mood and affect; Gait is intact with no imbalance; Cranial nerves I-XII are intact Head and Face Inspection: Normocephalic and atraumatic without mass or lesion Palpation: Facial skeleton intact without bony stepoffs Salivary Glands: No mass or tenderness Facial Strength: Facial motility symmetric and full bilaterally ENT  Pinna: External ear intact and fully developed External canal: Canal is patent with intact skin Tympanic Membrane: Clear and mobile External  Nose: No scar or anatomic deformity Internal Nose: Septum is straight. No polyp, or purulence. Mucosal edema and erythema present.  Bilateral inferior turbinate hypertrophy.  Lips, Teeth, and gums: Mucosa and teeth intact and viable TMJ: No pain to palpation with full mobility Oral cavity/oropharynx: No erythema or exudate, no lesions present Nasopharynx: No mass or lesion with intact mucosa Hypopharynx: Intact mucosa without pooling of secretions Larynx Glottic: Full true vocal cord mobility without lesion or mass Supraglottic: Normal appearing epiglottis and AE folds Interarytenoid Space: Moderate pachydermia&edema Subglottic Space: Patent without lesion or edema Neck Neck and Trachea: Midline trachea without mass or lesion Thyroid : No mass or nodularity Lymphatics: No lymphadenopathy  Procedure: Preoperative diagnosis: chronic dysphonia   Postoperative diagnosis:   Same L VF paralysis and bilateral VF atrophy glottic insufficiency  Procedure: Flexible fiberoptic laryngoscopy  Surgeon: Elena Larry, MD  Anesthesia: Topical lidocaine  and Afrin Complications: None Condition is stable throughout exam  Indications and consent:  The patient presents to the clinic with above symptoms. Indirect laryngoscopy view was incomplete. Thus it was recommended that they undergo a flexible fiberoptic laryngoscopy. All of the risks, benefits, and potential complications were reviewed with the patient preoperatively and verbal informed consent was obtained.  Procedure: The patient was seated upright in the clinic. Topical lidocaine  and Afrin were applied to the nasal cavity. After adequate anesthesia had occurred, I then proceeded to pass the flexible telescope into the nasal cavity. The nasal cavity was patent without rhinorrhea or polyp. The nasopharynx was also patent without mass or lesion. The base of tongue was visualized and was normal. There were no signs of pooling of secretions in the  piriform sinuses. The true vocal folds had Left vocal fold paralysis. There were no signs of glottic or supraglottic mucosal lesion or mass. There was moderate interarytenoid pachydermia and post cricoid edema. The telescope was then slowly withdrawn and the patient tolerated the procedure throughout.   Studies Reviewed: CT chest 02/03/23 IMPRESSION: Benign chest CT.  No explanation for vocal cord paresis.  CT neck 02/03/23 IMPRESSION: Findings at the left local cord consistent with history of paresis. No underlying cause is seen in the neck or upper chest.  Assessment/Plan: Encounter Diagnoses  Name Primary?   Complete paralysis of left vocal cord    Dysphonia Yes   Hoarseness    Age-related vocal fold atrophy    Glottic insufficiency     Assessment and Plan Assessment & Plan Chronic dysphonia for 2 yrs Left vocal cord paralysis Left vocal cord paralysis for two years, worsening over the past year. Diagnosed in January 2025 by outside ENT and had negative CT neck and chest. Spontaneous recovery unlikely since his sx were present for 2 yrs at this point. Permanent implant considered reasonable. We discussed medialization thyroplasty and he would like to proceed after risks and benefits were discussed.  - Schedule  for surgery     Thank you for allowing me to participate in the care of this patient. Please do not hesitate to contact me with any questions or concerns.   Elena Larry, MD Otolaryngology [facility] Phone: [phone] Fax: [phone]    10/06/2023, 9:14 AM

## 2023-10-06 NOTE — Progress Notes (Signed)
 Patient just took medication not too long ago.

## 2023-10-20 DIAGNOSIS — H40153 Residual stage of open-angle glaucoma, bilateral: Secondary | ICD-10-CM | POA: Diagnosis not present

## 2023-11-04 ENCOUNTER — Ambulatory Visit (HOSPITAL_COMMUNITY): Admit: 2023-11-04

## 2023-11-04 SURGERY — MEDIALIZATION THYROPLASTY
Anesthesia: General | Laterality: Bilateral

## 2023-11-05 ENCOUNTER — Encounter (INDEPENDENT_AMBULATORY_CARE_PROVIDER_SITE_OTHER): Payer: Self-pay

## 2023-11-05 ENCOUNTER — Ambulatory Visit (INDEPENDENT_AMBULATORY_CARE_PROVIDER_SITE_OTHER): Payer: Self-pay

## 2023-11-05 VITALS — BP 175/75 | HR 60 | Temp 97.7°F | Ht 66.0 in | Wt 165.0 lb

## 2023-11-05 DIAGNOSIS — R490 Dysphonia: Secondary | ICD-10-CM

## 2023-11-05 DIAGNOSIS — J3801 Paralysis of vocal cords and larynx, unilateral: Secondary | ICD-10-CM | POA: Diagnosis not present

## 2023-11-05 DIAGNOSIS — J383 Other diseases of vocal cords: Secondary | ICD-10-CM

## 2023-11-05 MED ORDER — LORAZEPAM 1 MG PO TABS: 1.0000 mg | ORAL_TABLET | Freq: Two times a day (BID) | ORAL | 1 refills | Status: DC | PRN

## 2023-11-05 NOTE — Patient Instructions (Addendum)

## 2023-11-05 NOTE — Progress Notes (Signed)
 Dear Dr. Sadie, Here is my assessment for our mutual patient, Terry Long. Thank you for allowing me the opportunity to care for your patient. Please do not hesitate to contact me should you have any other questions. Sincerely, Dr. Penne Croak  Otolaryngology Clinic Note Referring provider: Dr. Sadie HPI:  Discussed the use of AI scribe software for clinical note transcription with the patient, who gave verbal consent to proceed.  History of Present Illness Terry Long is a 73 year old male who presents with worsening hoarseness over the past two years. He is accompanied by his daughter, Sonny.  Dysphonia - Progressively worsening hoarse and raspy voice over the past two years - No known initial cause for symptom onset - No preceding significant cold or respiratory infection - Previous evaluations include CT scans of the neck and chest - No prior surgeries related to this issue  Nasal obstruction - Difficulty breathing through the nose - Attributed to a deviated septum - No history of nasal trauma  Gastroesophageal reflux symptoms - History of acid reflux - Omeprazole 40 mg daily provides effective symptom control - No ongoing symptoms of reflux while on medication  Occupational and environmental exposures - 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 I personally reviewed and interpreted CT neck and chest - no identifiable source for left sided TVF paresis/paralysis  PMH/Meds/All/SocHx/FamHx/ROS:    Past Medical History:  Diagnosis Date   Diabetes mellitus without complication (HCC)    GERD (gastroesophageal reflux disease)    Hypertension    Peptic ulcer      Past Surgical History:  Procedure Laterality Date   COLONOSCOPY     COLONOSCOPY N/A 10/24/2016   Procedure: COLONOSCOPY;  Surgeon: Viktoria Lamar DASEN, MD;  Location: [facility] ENDOSCOPY;  Service: Endoscopy;  Laterality: N/A;   ESOPHAGOGASTRODUODENOSCOPY     ESOPHAGOGASTRODUODENOSCOPY (EGD) WITH PROPOFOL  N/A 10/24/2016   Procedure: ESOPHAGOGASTRODUODENOSCOPY (EGD) WITH PROPOFOL ;  Surgeon: Viktoria Lamar DASEN, MD;  Location: [facility] ENDOSCOPY;  Service: Endoscopy;  Laterality: N/A;   PROSTATE SURGERY     SHOULDER ARTHROSCOPY WITH SUBACROMIAL DECOMPRESSION AND OPEN ROTATOR C Right 03/24/2019   Procedure: RIGHT SHOULDER ARTHROSCOPY WITH DEBRIDEMENT, DECOMPRESSION, AND ROTATOR CUFF REPAIR;  Surgeon: Edie Norleen PARAS, MD;  Location: [facility] ORS;  Service: Orthopedics;  Laterality: Right;    History reviewed. No pertinent family history.   Social Connections: Not on file      Current Outpatient Medications:    amLODipine (NORVASC) 5 MG tablet, Take 5 mg by mouth daily., Disp: , Rfl:    aspirin EC 81 MG tablet, Take 81 mg by mouth daily. , Disp: , Rfl:    finasteride  (PROSCAR ) 5 MG tablet, Take 1 tablet (5 mg total) by mouth daily., Disp: 90 tablet, Rfl: 3   latanoprost (XALATAN) 0.005 % ophthalmic solution, Place 1 drop into both eyes at bedtime. , Disp: , Rfl:    levothyroxine (SYNTHROID) 25 MCG tablet, Take by mouth., Disp: , Rfl:    LORazepam (ATIVAN) 1 MG tablet, Take 1 tablet (1 mg total) by mouth 2 (two) times daily as needed for anxiety., Disp: 2 tablet,  Rfl: 1   losartan (COZAAR) 50 MG tablet, Take 50 mg by mouth daily. , Disp: , Rfl:    omeprazole (PRILOSEC) 40 MG capsule, Take 40 mg by mouth daily. , Disp: , Rfl:    tamsulosin  (FLOMAX ) 0.4 MG CAPS capsule, Take 1 capsule (0.4 mg total) by mouth daily., Disp: 30 capsule, Rfl: 5    metFORMIN  (GLUCOPHAGE ) 500 MG tablet, Take by mouth., Disp: , Rfl:    Physical Exam:   BP (!) 175/75 (BP Location: Right Arm, Patient Position: Sitting, Cuff Size: Normal)   Pulse 60   Temp 97.7 F (36.5 C) (Oral)   Ht 5' 6 (1.676 m)   Wt 165 lb (74.8 kg)   SpO2 95%   BMI 26.63 kg/m   The patient was awake, alert, and appropriate. The external ears were inspected, and otoscopy was performed to evaluate the external auditory canals and tympanic membranes. The nasal cavity and septum were examined for mucosal changes, obstruction, or discharge. The oral cavity and oropharynx were inspected for mucosal lesions, infection, or tonsillar hypertrophy. The neck was palpated for lymphadenopathy, thyroid  abnormalities, or other masses. Cranial nerve function was grossly intact.  Pertinent Findings: Physical Exam HEENT: Left vocal cord weakness. Bilateral septal deviation. Tolerated scope well. Hoarseness, breathy voice  Seprately Identifiable Procedures:  I personally ordered, reviewed and interpreted the following with the patient today  Procedure Note Pre-procedure diagnosis:  Dysphonia  Post-procedure diagnosis: Same Procedure: Transnasal Fiberoptic Laryngoscopy, CPT 31575 - Mod 25 Indication: dysphonia significantly worsened last few months Complications: None apparent EBL: 0 mL  The procedure was undertaken to further evaluate the patient's complaint of dysphonia, with mirror exam inadequate for appropriate examination due to gag reflex and poor patient tolerance  Procedure:  Patient was identified as correct patient. Verbal consent was obtained. The nose was sprayed with oxymetazoline and 4% lidocaine . The The flexible laryngoscope was passed through the nose to view the nasal cavity, pharynx (oropharynx, hypopharynx) and larynx.  The larynx was examined at rest and during multiple phonatory tasks. Documentation was obtained and reviewed with patient. The scope was removed. The patient  tolerated the procedure well.  Findings: The nasal cavity and nasopharynx did not reveal any masses or lesions, mucosa appeared to be without obvious lesions. The tongue base, pharyngeal walls, piriform sinuses, vallecula, epiglottis and postcricoid region are normal in appearance EXCEPT: left vocal fold paralysis, bowing of vocal folds bilaterally.  Good excursion right TVF. The visualized portion of the subglottis and proximal trachea is widely patent. The right vocal fold is mobile . There are no lesions on the free edge of the vocal folds nor elsewhere in the larynx worrisome for malignancy.    Electronically signed by: Penne Croak, DO 11/05/2023 4:25 PM  Impression & Plans:  Teddie Mehta is a 73 y.o. male  1. Hoarseness   2. Glottic insufficiency   3. Age-related vocal fold atrophy   4. Vocal fold paralysis, left    - Findings and diagnoses discussed in detail with the patient. - Risks, benefits, and alternatives were reviewed. Through shared decision making, the patient elects to proceed with below. Assessment & Plan Left vocal cord paresis Chronic left vocal cord paresis likely from nerve damage post-upper respiratory infection.  - Schedule in-office temporary vocal cord filler injection with numbing spray and possible Ativan. Discussed risks: minor bleeding, temporary increased difficulty breathing, rare allergic reaction. - Refer to speech therapy for voice exercises. - Consider pulmonology referral for lung function evaluation due to  chemical exposure.  Gastroesophageal reflux disease (GERD) GERD managed with omeprazole, but symptoms not fully controlled. - Continue omeprazole 40 mg daily. - Consider increasing dosage if symptoms persist.  - Orders placed:  Orders Placed This Encounter  Procedures   Ambulatory referral to Speech Therapy   - Medications prescribed/continued/adjusted:  Meds ordered this encounter  Medications   LORazepam (ATIVAN) 1 MG tablet     Sig: Take 1 tablet (1 mg total) by mouth 2 (two) times daily as needed for anxiety.    Dispense:  2 tablet    Refill:  1   - Education materials provided to the patient. - Follow up: schedule in office procedure with assistant. Patient instructed to return sooner or go to the ED if new/worsening symptoms develop.  Thank you for allowing me the opportunity to care for your patient. Please do not hesitate to contact me should you have any other questions.  Sincerely, Penne Croak, DO Otolaryngologist (ENT) [facility] Phone: [phone] Fax: [phone]  11/05/2023, 4:25 PM

## 2023-11-19 ENCOUNTER — Ambulatory Visit (INDEPENDENT_AMBULATORY_CARE_PROVIDER_SITE_OTHER)

## 2023-11-19 VITALS — BP 169/75 | HR 57 | Temp 97.7°F

## 2023-11-19 DIAGNOSIS — J3801 Paralysis of vocal cords and larynx, unilateral: Secondary | ICD-10-CM

## 2023-11-19 DIAGNOSIS — J383 Other diseases of vocal cords: Secondary | ICD-10-CM

## 2023-11-19 NOTE — Progress Notes (Unsigned)
 Therapeutic Vocal Cord Injection CPT 68425-49 ATTENDING: Penne Croak, DO   PREOPERATIVE DIAGNOSIS(ES): 1. left vocal cord paralysis 2. Hoarseness; atrophy bilateral, L>R 3. Glottic insufficiency  POSTOPERATIVE DIAGNOSIS(ES): Same  PROCEDURE PERFORMED: Laryngoscopy with restylane injection into the bilateral lateral thyroarytenoid muscle(s)  INDICATIONS FOR PROCEDURE: The risks and benefits of the surgical procedure have been explained in detail to the patient and they have elected to proceed.  CONSENT:  Informed consent was obtained prior to the procedure after discussion of risks, benefits, and alternatives and expected outcomes were discussed with the patient; consent placed in chart. The possibilities of reaction to medication, pulmonary aspiration, bleeding, infection, the need for additional procedures, failure to diagnose a condition, and creating a complication requiring transfusion or operation were discussed with the patient. The patient concurred with the proposed plan, giving informed consent.    UNIVERSAL PROTOCOL/ TIMEOUT: Preprocedure verification is complete- patient verified and consents confirmed.  ANESTHESIA: local anesthesia H&P REVIEW: The patient's history and physical were reviewed today prior to procedure. All medications were reviewed and updated as well.  PROCEDURE DETAILS:  The patient was brought to the clinic and placed in a seated position.  The anterior neck skin was then cleansed with alcohol  and 1% Lidocaine  was used to infiltrate the skin overlying the thyrohyoid membrane. Patient had a NEB with 4% lidocaine  prior the start of the procedure, to ensure good anesthesia and procedure tolerance. Afrin/Lidocaine  mixture was then used to anesthetize the nasal passages. The 1.5-inch 23-gauge needle was bent with two gentle curves in same direction.  The flexible laryngoscope was then passed through the patient's nasal passageway and advanced into the larynx. The  nasopharynx was free of any lesions. The scope was passed behind the soft palate and directed toward the base of tongue. The supraglottic structures were normal in appearance. The true folds show atrophy and left vocal fold paralysis. The 23-gauge needle was passed through the petiole and 4% lidocaine  was dripped on the cords while the patient phonated. It was then attached to a luer-lock syringe filled with the filler and advanced into the vocal fold, and injection was performed into the bilateral thyroarytenoid muscle(s) at a site just anterior to the vocal process. A second injection was performed at mid-fold. The augmentation carried out until some overmedialization was noted. This was then repeated on the contralateral side. The needle was then removed from the vocal fold and the airway. The scope was removed from the nasal cavity. This completed the procedure. The patient tolerated the procedure well. IMPLANTS: Restylane-L Hyaluronic Acid  ESTIMATED BLOOD LOSS: None  SPECIMEN(S) REMOVED: None  DISPOSITION OF SPECIMEN(S): NA.  FINDINGS:  No evidence of a hematoma and the airway remained patent. Full syringe used on the left, half syringe used on the right.   CONDITION: Stable  COMPLICATIONS:The patient tolerated the procedure well without apparent complications and was ambulatory.  NOTES: will likely require repeat injection on the left side 2/2 severe atrophy, scoped through left side   PLAN: RTC 3 weeks for repeat videostrobe

## 2023-11-26 ENCOUNTER — Encounter (INDEPENDENT_AMBULATORY_CARE_PROVIDER_SITE_OTHER): Admitting: Otolaryngology

## 2023-12-17 ENCOUNTER — Ambulatory Visit (INDEPENDENT_AMBULATORY_CARE_PROVIDER_SITE_OTHER)

## 2023-12-17 VITALS — BP 199/83 | HR 45 | Temp 98.0°F | Wt 165.0 lb

## 2023-12-17 DIAGNOSIS — J342 Deviated nasal septum: Secondary | ICD-10-CM | POA: Diagnosis not present

## 2023-12-17 DIAGNOSIS — J383 Other diseases of vocal cords: Secondary | ICD-10-CM | POA: Diagnosis not present

## 2023-12-17 DIAGNOSIS — J343 Hypertrophy of nasal turbinates: Secondary | ICD-10-CM | POA: Diagnosis not present

## 2023-12-17 DIAGNOSIS — I10 Essential (primary) hypertension: Secondary | ICD-10-CM

## 2023-12-17 DIAGNOSIS — R490 Dysphonia: Secondary | ICD-10-CM | POA: Diagnosis not present

## 2023-12-17 DIAGNOSIS — J3801 Paralysis of vocal cords and larynx, unilateral: Secondary | ICD-10-CM

## 2023-12-17 NOTE — Progress Notes (Signed)
 Dear Dr. Sadie, Here is my assessment for our mutual patient, Terry Long. Thank you for allowing me the opportunity to care for your patient. Please do not hesitate to contact me should you have any other questions. Sincerely, Dr. Penne Croak  Otolaryngology Clinic Note Referring provider: Dr. Sadie HPI:  Discussed the use of AI scribe software for clinical note transcription with the patient, who gave verbal consent to proceed.  History of Present Illness 73M s/p restylane injection bilaterally.   Reports 2 weeks of hoarseness followed by significant improvement No voice complaints. Swallowing well Happy with injection  Nose congestion. Occasionally does afrin with some relief. No hx of nasal trauma. Positive hx of seasonal environmental allergies.   PMH/Meds/All/SocHx/FamHx/ROS:   Past Medical History:  Diagnosis Date   Diabetes mellitus without complication (HCC)    GERD (gastroesophageal reflux disease)    Hypertension    Peptic ulcer      Past Surgical History:  Procedure Laterality Date   COLONOSCOPY     COLONOSCOPY N/A 10/24/2016   Procedure: COLONOSCOPY;  Surgeon: Viktoria Lamar DASEN, MD;  Location: [facility] ENDOSCOPY;  Service: Endoscopy;  Laterality: N/A;   ESOPHAGOGASTRODUODENOSCOPY     ESOPHAGOGASTRODUODENOSCOPY (EGD) WITH PROPOFOL  N/A 10/24/2016   Procedure: ESOPHAGOGASTRODUODENOSCOPY (EGD) WITH PROPOFOL ;  Surgeon: Viktoria Lamar DASEN, MD;  Location: [facility] ENDOSCOPY;  Service: Endoscopy;  Laterality: N/A;   PROSTATE SURGERY     SHOULDER ARTHROSCOPY WITH SUBACROMIAL DECOMPRESSION AND OPEN ROTATOR C Right 03/24/2019   Procedure: RIGHT SHOULDER ARTHROSCOPY WITH DEBRIDEMENT, DECOMPRESSION, AND ROTATOR CUFF REPAIR;  Surgeon: Edie Norleen PARAS, MD;  Location: [facility] ORS;  Service: Orthopedics;  Laterality: Right;    No family history on file.   Social Connections: Not on file     Current Medications[1]   Physical Exam:   BP (!) 199/83 (BP Location: Left  Leg, Patient Position: Sitting, Cuff Size: Normal)   Pulse (!) 45   Temp 98 F (36.7 C)   Wt 165 lb (74.8 kg)   SpO2 96%   BMI 26.63 kg/m   The patient was awake, alert, and appropriate. The external ears were inspected, and otoscopy was performed to evaluate the external auditory canals and tympanic membranes. The nasal cavity and septum were examined for mucosal changes, obstruction, or discharge. The oral cavity and oropharynx were inspected for mucosal lesions, infection, or tonsillar hypertrophy. The neck was palpated for lymphadenopathy, thyroid  abnormalities, or other masses. Cranial nerve function was grossly intact.  Pertinent Findings: Physical Exam BP (!) 199/83 (BP Location: Left Leg, Patient Position: Sitting, Cuff Size: Normal)   Pulse (!) 45   Temp 98 F (36.7 C)   Wt 165 lb (74.8 kg)   SpO2 96%   BMI 26.63 kg/m  General: Well developed, well nourished. No acute distress. Voice clear! No hoarseness or weakness Head/Face: Normocephalic. No sinus tenderness. Facial nerve intact and equal bilaterally. No facial lacerations. Eyes: PERRL, no scleral icterus or conjunctival hemorrhage. EOMI. Ears: No gross deformity. Normal external canal. Tympanic membrane clear bilaterally Hearing: Normal speech reception.  Nose: No gross deformity or lesions. No purulent discharge. Bilateral DNS, inferior turbinate hypertrophy b/l. Mouth/Oropharynx: Lips without any lesions. Dentition fair. No mucosal lesions within the oropharynx. No tonsillar enlargement, exudate, or lesions. Pharyngeal walls symmetrical. Uvula midline. Tongue midline without lesions. Larynx: See TFL if applicable Nasopharynx: See TFL if applicable Neck: Trachea midline. No masses. No thyromegaly or nodules palpated. No crepitus. Lymphatic: No lymphadenopathy in the neck. Respiratory: No stridor or distress. Room air.  Cardiovascular: Regular rate and rhythm. Extremities: No edema or cyanosis. Warm and  well-perfused. Skin: No scars or lesions on face or neck. Neurologic: CN II-XII grossly intact. Moving all extremities without gross abnormality. Other:    Seprately Identifiable Procedures:  I personally ordered, reviewed and interpreted the following with the patient today  Procedure Note Pre-procedure diagnosis:  Dysphonia  Post-procedure diagnosis: Same Procedure: Video Laryngostroboscopy; CPT [phone] - Mod 25 Indication: See pre-procedure diagnosis:  Date: 12/17/2023   The procedure was undertaken to further evaluate the patient's complaint of hoarseness; a transnasal video laryngostroboscopy (VLS) was performed to closely evaluate the patient's laryngeal biomechanics and vocal fold oscillation.  Patient was identified as correct patient. Verbal consent was obtained. The nose was sprayed bilaterally as below. The flexible scope was passed through the nose to view the nasal cavity, pharynx (oropharynx, hypopharynx) and larynx.  The larynx was examined at rest and during multiple phonatory tasks. Documentation was obtained and reviewed with patient. The scope was removed. The patient tolerated the procedure well.  VLS Exam Detail:  Amplitude - Left: normal Amplitude - Right: normal  Mucosal Wave - Left: normal Mucosal Wave - Right: normal  Vocal Fold Motion - Left: No motion  Vocal Fold Motion - Right: No motion abnormalities  Vertical Level of Approximation: equal Glottic Closure: complete  Phase Symmetry: symmetric Periodicity (Regularity): regular  Supraglottic Hyperfunction: mixed Nasopharynx: Bilateral tori wnl, no masses or lesions  Airway: Widely Patent Additional Observations: none   Exam Summary: Examiner: Penne Croak DO Diagnostician: Penne Croak, DO  Anesthesia: Yes, Topical (oxymetazoline and 4% lidocaine ) Sensitivity to Scope: None, left nare   Summary of Findings: The videostroboscopic exam revealed acceptable A-P and lateral compression of the supraglottis with  phonation. There were no masses, lesions, or vocal fold motion abnormalities on exam.   Electronically signed by: Penne Croak, DO 12/17/2023 9:06 AM  Impression & Plans:  Terry Long is a 73 y.o. male  1. DNS (deviated nasal septum)   2. Hypertrophy of inferior nasal turbinate   3. Complete paralysis of left vocal cord   4. Glottic insufficiency   5. Dysphonia   6. Hoarseness    - Findings and diagnoses discussed in detail with the patient. - Risks, benefits, and alternatives were reviewed. Through shared decision making, the patient elects to proceed with below. Assessment & Plan Hoarseness s/p restylane injection - he is very pleased with his voice. He is aware that the injection is temporary.  - declines voice therapy  Severe nasal congestion with DNS to left and right - start flonase daily  HTN Recommend taking additional dose of blood pressure medication - amlodipine- today and call PCP office for urgent appointment.   - Orders placed: No orders of the defined types were placed in this encounter.  - Medications prescribed/continued/adjusted:  Meds ordered this encounter  Medications   fluticasone (FLONASE) 50 MCG/ACT nasal spray    Sig: Place 2 sprays into both nostrils daily.    Dispense:  16 g    Refill:  6   - Education materials provided to the patient. - Follow up: 4 months or sooner for recheck/strobe. Patient instructed to return sooner or go to the ED if new/worsening symptoms develop.   Thank you for allowing me the opportunity to care for your patient. Please do not hesitate to contact me should you have any other questions.  Sincerely, Penne Croak, DO Otolaryngologist (ENT) [facility] Phone: [phone] Fax: [phone]  12/17/2023, 9:06  AM        [1]  Current Outpatient Medications:    amLODipine (NORVASC) 5 MG tablet, Take 5 mg by mouth daily., Disp: , Rfl:    aspirin EC 81 MG tablet, Take 81 mg by  mouth daily. , Disp: , Rfl:    fluticasone (FLONASE) 50 MCG/ACT nasal spray, Place 2 sprays into both nostrils daily., Disp: 16 g, Rfl: 6   metFORMIN  (GLUCOPHAGE ) 500 MG tablet, Take by mouth., Disp: , Rfl:    omeprazole (PRILOSEC) 40 MG capsule, Take 40 mg by mouth daily. , Disp: , Rfl:    tamsulosin  (FLOMAX ) 0.4 MG CAPS capsule, Take 1 capsule (0.4 mg total) by mouth daily., Disp: 30 capsule, Rfl: 5   finasteride  (PROSCAR ) 5 MG tablet, Take 1 tablet (5 mg total) by mouth daily. (Patient not taking: Reported on 12/17/2023), Disp: 90 tablet, Rfl: 3   latanoprost (XALATAN) 0.005 % ophthalmic solution, Place 1 drop into both eyes at bedtime.  (Patient not taking: Reported on 12/17/2023), Disp: , Rfl:    levothyroxine (SYNTHROID) 25 MCG tablet, Take by mouth. (Patient not taking: Reported on 12/17/2023), Disp: , Rfl:    LORazepam  (ATIVAN ) 1 MG tablet, Take 1 tablet (1 mg total) by mouth 2 (two) times daily as needed for anxiety. (Patient not taking: Reported on 12/17/2023), Disp: 2 tablet, Rfl: 1   losartan (COZAAR) 50 MG tablet, Take 50 mg by mouth daily.  (Patient not taking: Reported on 12/17/2023), Disp: , Rfl:

## 2024-01-12 NOTE — Progress Notes (Signed)
 Terry Long                                          MRN: [id number]   01/12/2024   The [facility] Quality Team Specialist reviewed this patient medical record for the purposes of chart review for care gap closure. The following were reviewed: chart review for care gap closure-controlling blood pressure.    [facility] Quality Team

## 2024-02-02 ENCOUNTER — Encounter: Payer: Self-pay | Admitting: Urology

## 2024-02-02 ENCOUNTER — Ambulatory Visit: Admitting: Urology

## 2024-02-02 VITALS — BP 138/74 | HR 78 | Ht 66.0 in | Wt 168.0 lb

## 2024-02-02 DIAGNOSIS — N401 Enlarged prostate with lower urinary tract symptoms: Secondary | ICD-10-CM | POA: Diagnosis not present

## 2024-02-02 DIAGNOSIS — Z87898 Personal history of other specified conditions: Secondary | ICD-10-CM

## 2024-02-02 DIAGNOSIS — R310 Gross hematuria: Secondary | ICD-10-CM | POA: Diagnosis not present

## 2024-02-02 LAB — MICROSCOPIC EXAMINATION

## 2024-02-02 LAB — URINALYSIS, COMPLETE
Bilirubin, UA: NEGATIVE
Ketones, UA: NEGATIVE
Leukocytes,UA: NEGATIVE
Nitrite, UA: NEGATIVE
RBC, UA: NEGATIVE
Specific Gravity, UA: 1.03 (ref 1.005–1.030)
Urobilinogen, Ur: 0.2 mg/dL (ref 0.2–1.0)
pH, UA: 5 (ref 5.0–7.5)

## 2024-02-02 LAB — BLADDER SCAN AMB NON-IMAGING: Scan Result: 34

## 2024-02-02 NOTE — Progress Notes (Signed)
 "  02/02/2024 3:01 PM   [employer] Meza [birth year] [id number]  Referring provider: Sadie Manna, MD [address] [facility] [place],  [place] [address]  Chief Complaint  Patient presents with   Hematuria   Urologic history: 1.  BPH with lower urinary tract symptoms  PVP 2005 TURP 2011 at [facility] TURP 07/2012 by Dr. Ike in [facility] Cystolitholapaxy prostatic urethral calculus, internal urethrotomy and TURP 03/2013 by Dr. Ike   2.  Acute prostatitis/gross hematuria 07/25/2020 Urine culture positive [facility] CTU without upper tract abnormalities Cystoscopy with TUR changes and distal adenoma regrowth, inflammatory changes proximal prostate with calcification Repeat cystoscopy 07/17/2022: Proximal adenoma regrowth w/ inflammatory changes/hypervascularity; no bladder mucosal lesions Started finasteride  7/24  HPI: Terry Long is a 74 y.o. male presents for annual follow-up.  Doing well since last year's visit.  No bothersome LUTS and denies recurrent gross hematuria Remains on finasteride /tamsulosin  Additional urologic ROS negative  PMH: Past Medical History:  Diagnosis Date   Diabetes mellitus without complication (HCC)    GERD (gastroesophageal reflux disease)    Hypertension    Peptic ulcer     Surgical History: Past Surgical History:  Procedure Laterality Date   COLONOSCOPY     COLONOSCOPY N/A 10/24/2016   Procedure: COLONOSCOPY;  Surgeon: Viktoria Lamar DASEN, MD;  Location: [facility] ENDOSCOPY;  Service: Endoscopy;  Laterality: N/A;   ESOPHAGOGASTRODUODENOSCOPY     ESOPHAGOGASTRODUODENOSCOPY (EGD) WITH PROPOFOL  N/A 10/24/2016   Procedure: ESOPHAGOGASTRODUODENOSCOPY (EGD) WITH PROPOFOL ;  Surgeon: Viktoria Lamar DASEN, MD;  Location: [facility] ENDOSCOPY;  Service: Endoscopy;  Laterality: N/A;   PROSTATE SURGERY     SHOULDER ARTHROSCOPY WITH SUBACROMIAL DECOMPRESSION AND OPEN ROTATOR C Right 03/24/2019   Procedure: RIGHT SHOULDER ARTHROSCOPY  WITH DEBRIDEMENT, DECOMPRESSION, AND ROTATOR CUFF REPAIR;  Surgeon: Edie Norleen PARAS, MD;  Location: [facility] ORS;  Service: Orthopedics;  Laterality: Right;    Home Medications:  Allergies as of 02/02/2024   No Known Allergies      Medication List        Accurate as of February 02, 2024  3:01 PM. If you have any questions, ask your nurse or doctor.          STOP taking these medications    levothyroxine 25 MCG tablet Commonly known as: SYNTHROID Stopped by: Glendia Barba, MD   LORazepam  1 MG tablet Commonly known as: ATIVAN  Stopped by: Glendia Barba, MD   losartan 50 MG tablet Commonly known as: COZAAR Stopped by: Glendia Barba, MD       TAKE these medications    amLODipine 5 MG tablet Commonly known as: NORVASC Take 5 mg by mouth daily.   aspirin EC 81 MG tablet Take 81 mg by mouth daily.   finasteride  5 MG tablet Commonly known as: PROSCAR  Take 1 tablet (5 mg total) by mouth daily.   fluticasone 50 MCG/ACT nasal spray Commonly known as: FLONASE Place 2 sprays into both nostrils daily.   latanoprost 0.005 % ophthalmic solution Commonly known as: XALATAN Place 1 drop into both eyes at bedtime.   metFORMIN  500 MG tablet Commonly known as: GLUCOPHAGE  Take by mouth.   omeprazole 40 MG capsule Commonly known as: PRILOSEC Take 40 mg by mouth daily.   tamsulosin  0.4 MG Caps capsule Commonly known as: FLOMAX  Take 1 capsule (0.4 mg total) by mouth daily.        Allergies: Allergies[1]  Family History: No family history on file.  Social History:  reports that he has never smoked. He has  never used smokeless tobacco. He reports that he does not drink alcohol  and does not use drugs.   Physical Exam: BP 138/74   Pulse 78   Ht 5' 6 (1.676 m)   Wt 168 lb (76.2 kg)   BMI 27.12 kg/m   Constitutional:  Alert, No acute distress. HEENT: [place] AT Respiratory: Normal respiratory effort, no increased work of breathing. Psychiatric: Normal mood and  affect.  Laboratory Data:  Urinalysis Microscopy negative   Assessment & Plan:    1.  History gross hematuria No recurrent hematuria since starting finasteride  UA today clear  2.  BPH with LUTS No bothersome symptoms on tamsulosin /finasteride  PVR today 34 mL Continue annual follow-up    Glendia JAYSON Barba, MD  [facility] [address] [place], [place] [address] [phone]    [1] No Known Allergies  "

## 2024-02-04 ENCOUNTER — Ambulatory Visit: Payer: Self-pay | Admitting: Urology

## 2024-04-14 ENCOUNTER — Ambulatory Visit (INDEPENDENT_AMBULATORY_CARE_PROVIDER_SITE_OTHER)

## 2024-04-18 ENCOUNTER — Ambulatory Visit (INDEPENDENT_AMBULATORY_CARE_PROVIDER_SITE_OTHER)

## 2025-02-01 ENCOUNTER — Ambulatory Visit: Admitting: Urology
